# Patient Record
Sex: Female | Born: 1955 | Race: White | Hispanic: No | Marital: Married | Smoking: Never smoker
Health system: Southern US, Community
[De-identification: ages and names within clinical notes are randomized; demographics above are authoritative.]

## PROBLEM LIST (undated history)

## (undated) DIAGNOSIS — M1990 Unspecified osteoarthritis, unspecified site: Secondary | ICD-10-CM

## (undated) DIAGNOSIS — M810 Age-related osteoporosis without current pathological fracture: Secondary | ICD-10-CM

## (undated) DIAGNOSIS — T7840XA Allergy, unspecified, initial encounter: Secondary | ICD-10-CM

## (undated) DIAGNOSIS — C801 Malignant (primary) neoplasm, unspecified: Secondary | ICD-10-CM

## (undated) DIAGNOSIS — K219 Gastro-esophageal reflux disease without esophagitis: Secondary | ICD-10-CM

## (undated) HISTORY — DX: Allergy, unspecified, initial encounter: T78.40XA

## (undated) HISTORY — DX: Age-related osteoporosis without current pathological fracture: M81.0

## (undated) HISTORY — DX: Unspecified osteoarthritis, unspecified site: M19.90

## (undated) HISTORY — PX: EYE SURGERY: SHX253

## (undated) HISTORY — DX: Malignant (primary) neoplasm, unspecified: C80.1

## (undated) HISTORY — DX: Gastro-esophageal reflux disease without esophagitis: K21.9

---

## 1997-07-30 ENCOUNTER — Other Ambulatory Visit: Admission: RE | Admit: 1997-07-30 | Discharge: 1997-07-30 | Payer: Self-pay | Admitting: Obstetrics and Gynecology

## 1998-08-16 ENCOUNTER — Other Ambulatory Visit: Admission: RE | Admit: 1998-08-16 | Discharge: 1998-08-16 | Payer: Self-pay | Admitting: Obstetrics and Gynecology

## 1999-04-11 ENCOUNTER — Encounter: Admission: RE | Admit: 1999-04-11 | Discharge: 1999-04-11 | Payer: Self-pay | Admitting: Obstetrics and Gynecology

## 1999-04-11 ENCOUNTER — Encounter: Payer: Self-pay | Admitting: Obstetrics and Gynecology

## 1999-08-18 ENCOUNTER — Other Ambulatory Visit: Admission: RE | Admit: 1999-08-18 | Discharge: 1999-08-18 | Payer: Self-pay | Admitting: Obstetrics and Gynecology

## 1999-10-12 ENCOUNTER — Encounter: Payer: Self-pay | Admitting: Obstetrics and Gynecology

## 1999-10-12 ENCOUNTER — Encounter: Admission: RE | Admit: 1999-10-12 | Discharge: 1999-10-12 | Payer: Self-pay | Admitting: Obstetrics and Gynecology

## 1999-12-26 ENCOUNTER — Other Ambulatory Visit: Admission: RE | Admit: 1999-12-26 | Discharge: 1999-12-26 | Payer: Self-pay | Admitting: Obstetrics and Gynecology

## 2000-02-20 ENCOUNTER — Other Ambulatory Visit: Admission: RE | Admit: 2000-02-20 | Discharge: 2000-02-20 | Payer: Self-pay | Admitting: Obstetrics and Gynecology

## 2000-10-10 ENCOUNTER — Other Ambulatory Visit: Admission: RE | Admit: 2000-10-10 | Discharge: 2000-10-10 | Payer: Self-pay | Admitting: Obstetrics and Gynecology

## 2000-11-29 ENCOUNTER — Encounter: Payer: Self-pay | Admitting: Obstetrics and Gynecology

## 2000-11-29 ENCOUNTER — Encounter: Admission: RE | Admit: 2000-11-29 | Discharge: 2000-11-29 | Payer: Self-pay | Admitting: Obstetrics and Gynecology

## 2002-02-27 ENCOUNTER — Encounter: Admission: RE | Admit: 2002-02-27 | Discharge: 2002-02-27 | Payer: Self-pay | Admitting: Obstetrics and Gynecology

## 2002-02-27 ENCOUNTER — Encounter: Payer: Self-pay | Admitting: Obstetrics and Gynecology

## 2002-03-06 ENCOUNTER — Encounter: Payer: Self-pay | Admitting: Obstetrics and Gynecology

## 2002-03-06 ENCOUNTER — Encounter: Admission: RE | Admit: 2002-03-06 | Discharge: 2002-03-06 | Payer: Self-pay | Admitting: Obstetrics and Gynecology

## 2002-04-17 ENCOUNTER — Other Ambulatory Visit: Admission: RE | Admit: 2002-04-17 | Discharge: 2002-04-17 | Payer: Self-pay | Admitting: Obstetrics and Gynecology

## 2003-04-30 ENCOUNTER — Other Ambulatory Visit: Admission: RE | Admit: 2003-04-30 | Discharge: 2003-04-30 | Payer: Self-pay | Admitting: Obstetrics and Gynecology

## 2004-06-01 ENCOUNTER — Other Ambulatory Visit: Admission: RE | Admit: 2004-06-01 | Discharge: 2004-06-01 | Payer: Self-pay | Admitting: Obstetrics and Gynecology

## 2008-01-30 HISTORY — PX: FOOT SURGERY: SHX648

## 2012-05-13 ENCOUNTER — Encounter: Payer: Self-pay | Admitting: Internal Medicine

## 2012-07-18 ENCOUNTER — Encounter: Payer: Self-pay | Admitting: Internal Medicine

## 2012-07-18 ENCOUNTER — Ambulatory Visit (AMBULATORY_SURGERY_CENTER): Payer: BC Managed Care – PPO | Admitting: *Deleted

## 2012-07-18 VITALS — Ht 65.0 in | Wt 153.0 lb

## 2012-07-18 DIAGNOSIS — Z1211 Encounter for screening for malignant neoplasm of colon: Secondary | ICD-10-CM

## 2012-07-18 MED ORDER — MOVIPREP 100 G PO SOLR: ORAL | Status: DC

## 2012-08-11 ENCOUNTER — Ambulatory Visit (AMBULATORY_SURGERY_CENTER): Payer: BC Managed Care – PPO | Admitting: Internal Medicine

## 2012-08-11 ENCOUNTER — Encounter: Payer: Self-pay | Admitting: Internal Medicine

## 2012-08-11 VITALS — BP 123/65 | HR 65 | Temp 97.3°F | Resp 14 | Ht 65.0 in | Wt 153.0 lb

## 2012-08-11 DIAGNOSIS — Z1211 Encounter for screening for malignant neoplasm of colon: Secondary | ICD-10-CM

## 2012-08-11 MED ORDER — SODIUM CHLORIDE 0.9 % IV SOLN: 500.0000 mL | INTRAVENOUS | Status: DC

## 2012-08-11 NOTE — Progress Notes (Signed)
Patient did not have preoperative order for IV antibiotic SSI prophylaxis. (G8918)  Patient did not experience any of the following events: a burn prior to discharge; a fall within the facility; wrong site/side/patient/procedure/implant event; or a hospital transfer or hospital admission upon discharge from the facility. (G8907)  

## 2012-08-11 NOTE — Progress Notes (Signed)
0829 2% LIDOCAINE 40MG  IV PREOP

## 2012-08-11 NOTE — Op Note (Signed)
[facility] [address] [place], [address]   COLONOSCOPY PROCEDURE REPORT  PATIENT: Monserratt, Knezevic  MR#: [id number] BIRTHDATE: 03/29/1955 , 56  yrs. old GENDER: Female ENDOSCOPIST: Roxy Cedar, MD REFERRED VH:QIONG Ross, M.D. PROCEDURE DATE:  08/11/2012 PROCEDURE:   Colonoscopy, screening ASA CLASS:   Class II INDICATIONS:average risk screening. MEDICATIONS: MAC sedation, administered by CRNA and propofol (Diprivan) 350mg  IV  DESCRIPTION OF PROCEDURE:   After the risks benefits and alternatives of the procedure were thoroughly explained, informed consent was obtained.  A digital rectal exam revealed no abnormalities of the rectum.   The [id number]  endoscope was introduced through the anus and advanced to the cecum, which was identified by both the appendix and ileocecal valve. No adverse events experienced.   The quality of the prep was excellent, using MoviPrep  The instrument was then slowly withdrawn as the colon was fully examined.      COLON FINDINGS: A normal appearing cecum, ileocecal valve, and appendiceal orifice were identified.  The ascending, hepatic flexure, transverse, splenic flexure, descending, sigmoid colon and rectum appeared unremarkable.  No polyps or cancers were seen. Retroflexed views revealed no abnormalities. The time to cecum=5 minutes 05 seconds.  Withdrawal time=10 minutes 15 seconds.  The scope was withdrawn and the procedure completed. COMPLICATIONS: There were no complications.  ENDOSCOPIC IMPRESSION: 1. Normal colon  RECOMMENDATIONS: 1. Continue current colorectal screening recommendations for "routine risk" patients with a repeat colonoscopy in 10 years.   eSigned:  Roxy Cedar, MD 08/11/2012 8:50 AM   cc: Miguel Aschoff, MD and The Patient

## 2012-08-11 NOTE — Patient Instructions (Addendum)
YOU HAD AN ENDOSCOPIC PROCEDURE TODAY AT THE [facility]: Refer to the procedure report that was given to you for any specific questions about what was found during the examination.  If the procedure report does not answer your questions, please call your gastroenterologist to clarify.  If you requested that your care partner not be given the details of your procedure findings, then the procedure report has been included in a sealed envelope for you to review at your convenience later.  YOU SHOULD EXPECT: Some feelings of bloating in the abdomen. Passage of more gas than usual.  Walking can help get rid of the air that was put into your GI tract during the procedure and reduce the bloating. If you had a lower endoscopy (such as a colonoscopy or flexible sigmoidoscopy) you may notice spotting of blood in your stool or on the toilet paper. If you underwent a bowel prep for your procedure, then you may not have a normal bowel movement for a few days.  DIET: Your first meal following the procedure should be a light meal and then it is ok to progress to your normal diet.  A half-sandwich or bowl of soup is an example of a good first meal.  Heavy or fried foods are harder to digest and may make you feel nauseous or bloated.  Likewise meals heavy in dairy and vegetables can cause extra gas to form and this can also increase the bloating.  Drink plenty of fluids but you should avoid alcoholic beverages for 24 hours.  ACTIVITY: Your care partner should take you home directly after the procedure.  You should plan to take it easy, moving slowly for the rest of the day.  You can resume normal activity the day after the procedure however you should NOT DRIVE or use heavy machinery for 24 hours (because of the sedation medicines used during the test).    SYMPTOMS TO REPORT IMMEDIATELY: A gastroenterologist can be reached at any hour.  During normal business hours, 8:30 AM to 5:00 PM Monday through Friday,  call [phone].  After hours and on weekends, please call the GI answering service at [phone] who will take a message and have the physician on call contact you.   Following lower endoscopy (colonoscopy or flexible sigmoidoscopy):  Excessive amounts of blood in the stool  Significant tenderness or worsening of abdominal pains  Swelling of the abdomen that is new, acute  Fever of 100F or higher  FOLLOW UP: Our staff will call the home number listed on your records the next business day following your procedure to check on you and address any questions or concerns that you may have at that time regarding the information given to you following your procedure. This is a courtesy call and so if there is no answer at the home number and we have not heard from you through the emergency physician on call, we will assume that you have returned to your regular daily activities without incident.  SIGNATURES/CONFIDENTIALITY: You and/or your care partner have signed paperwork which will be entered into your electronic medical record.  These signatures attest to the fact that that the information above on your After Visit Summary has been reviewed and is understood.  Full responsibility of the confidentiality of this discharge information lies with you and/or your care-partner.  Ok to continue your normal medications  Follow up colonoscopy in 10 years

## 2012-08-12 ENCOUNTER — Telehealth: Payer: Self-pay | Admitting: *Deleted

## 2012-08-12 NOTE — Telephone Encounter (Signed)
No identifier, left message, follow-up  

## 2013-05-22 LAB — HM PAP SMEAR: HM Pap smear: NORMAL

## 2013-07-09 ENCOUNTER — Ambulatory Visit: Payer: Self-pay | Admitting: Podiatry

## 2013-07-21 ENCOUNTER — Encounter: Payer: Self-pay | Admitting: Podiatry

## 2013-07-21 ENCOUNTER — Ambulatory Visit (INDEPENDENT_AMBULATORY_CARE_PROVIDER_SITE_OTHER): Payer: BC Managed Care – PPO

## 2013-07-21 ENCOUNTER — Ambulatory Visit (INDEPENDENT_AMBULATORY_CARE_PROVIDER_SITE_OTHER): Payer: BC Managed Care – PPO | Admitting: Podiatry

## 2013-07-21 VITALS — BP 132/75 | HR 95 | Resp 16 | Ht 65.0 in | Wt 147.0 lb

## 2013-07-21 DIAGNOSIS — M948X9 Other specified disorders of cartilage, unspecified sites: Secondary | ICD-10-CM

## 2013-07-21 DIAGNOSIS — M779 Enthesopathy, unspecified: Secondary | ICD-10-CM

## 2013-07-21 DIAGNOSIS — M7750 Other enthesopathy of unspecified foot: Secondary | ICD-10-CM

## 2013-07-21 DIAGNOSIS — M7752 Other enthesopathy of left foot: Secondary | ICD-10-CM

## 2013-07-21 DIAGNOSIS — M2580 Other specified joint disorders, unspecified joint: Secondary | ICD-10-CM

## 2013-07-21 NOTE — Progress Notes (Signed)
   Subjective:    Patient ID: Latoya Reyes, female    DOB: 08/01/1955, 58 y.o.   MRN: [id number]  HPI Comments: "I have pain in this foot"  Patient c/o aching 1st MPJ left foot for about 6 weeks. The area initially was extremely red, swollen and tight plantarly. Trouble walking. Sensitive in the AM. She has been taking Ibuprofen PRN and doing ROM exercises. Some better.     Review of Systems  All other systems reviewed and are negative.      Objective:   Physical Exam: I have reviewed her past medical history medications allergies surgeries social history and review of systems. Pulses are strongly palpable bilateral. Neurologic sensorium is intact per since once the monofilament. Deep tendon reflexes are intact bilateral and muscle strength is 5 over 5 dorsiflexors plantar flexors inverters everters all intrinsic musculature is intact. Orthopedic evaluation demonstrates all joints distal to the ankle a full range of motion without crepitation with exception of the first metatarsophalangeal joint which does have pain on end range of motion of this joint particularly on plantar flexion. She also has erythema and edema to the plantar aspect of the first metatarsophalangeal joint is overlying the sesamoids. Radiographic evaluation does not demonstrate any type of sesamoidal arthropathy however soft tissue increase in density is increased over this area.        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  Plan: Injected 2 mg of dexamethasone to the point of maximal tenderness of the left foot. Should this recur than an MRI will be necessary. This will rule out a soft tissue tumor.

## 2013-11-16 ENCOUNTER — Encounter: Payer: Self-pay | Admitting: Internal Medicine

## 2013-11-16 ENCOUNTER — Ambulatory Visit (INDEPENDENT_AMBULATORY_CARE_PROVIDER_SITE_OTHER): Payer: BC Managed Care – PPO | Admitting: Internal Medicine

## 2013-11-16 VITALS — BP 140/94 | HR 73 | Temp 98.1°F | Resp 18 | Ht 64.0 in | Wt 150.1 lb

## 2013-11-16 DIAGNOSIS — Z23 Encounter for immunization: Secondary | ICD-10-CM

## 2013-11-16 DIAGNOSIS — Z0000 Encounter for general adult medical examination without abnormal findings: Secondary | ICD-10-CM

## 2013-11-16 NOTE — Assessment & Plan Note (Signed)
Flu shot given today. She will have shingles shot when she returns with her mother to a visit in December. She is up-to-date on colonoscopy done in 2014. She has had tetanus shot. She sees OB/GYN and gets mammogram and Pap smear yearly. Spoke with her about bone health and exercise, she does walk 5 days per week.

## 2013-11-16 NOTE — Progress Notes (Signed)
   Subjective:    Patient ID: Latoya Reyes, female    DOB: 03/10/1955, 58 y.o.   MRN: [id number]  HPI The patient is a 58 YO female who is coming to establish care. She has no significant past medical history. She does not have any complaints at today's visit. She denies any significant arthralgias or joint discomfort. She denies any balance problems, hearing problems, vision problems. She denies any medications. She denies any past medical history. No significant family medical history. She would like to talk about the shingles shot as she is very concerned about getting shingles. She has seen several people have shingles and she cemented is not a pleasant experience. She had basic lab work done about a year or 2 ago at her workplace. She walks about 5 days per week and this is her alone time that helps her to destress and relax.  Review of Systems  Constitutional: Negative for fever, activity change, appetite change and fatigue.  HENT: Negative.   Respiratory: Negative for cough, chest tightness, shortness of breath and wheezing.   Cardiovascular: Negative for chest pain, palpitations and leg swelling.  Gastrointestinal: Negative for abdominal pain, diarrhea, constipation and abdominal distention.  Musculoskeletal: Negative.   Skin: Negative.   Neurological: Negative for dizziness, weakness, light-headedness and headaches.  Psychiatric/Behavioral: Negative.       Objective:   Physical Exam  Constitutional: She is oriented to person, place, and time. She appears well-developed and well-nourished.  HENT:  Head: Normocephalic and atraumatic.  Eyes: EOM are normal.  Neck: Normal range of motion.  Cardiovascular: Normal rate and regular rhythm.   No murmur heard. Pulmonary/Chest: Breath sounds normal. No respiratory distress. She has no wheezes. She has no rales.  Abdominal: Soft. Bowel sounds are normal.  Musculoskeletal: She exhibits no tenderness.  Neurological: She is alert and oriented  to person, place, and time. Coordination normal.  Skin: Skin is warm and dry.   Filed Vitals:   11/16/13 1009  BP: 140/94  Pulse: 73  Temp: 98.1 F (36.7 C)  TempSrc: Oral  Resp: 18  Height: 5\' 4"  (1.626 m)  Weight: 150 lb 1.9 oz (68.094 kg)  SpO2: 99%      Assessment & Plan:  Flu shot given at today's visit.

## 2013-11-16 NOTE — Progress Notes (Signed)
Pre visit review using our clinic review tool, if applicable. No additional management support is needed unless otherwise documented below in the visit note. 

## 2013-11-16 NOTE — Patient Instructions (Signed)
Go ahead and get the flu shot this Friday and then get the shingles shot here when you come back with your mom in December.  You are doing great and keep up the good work with the exercise.   Health Maintenance Adopting a healthy lifestyle and getting preventive care can go a long way to promote health and wellness. Talk with your health care provider about what schedule of regular examinations is right for you. This is a good chance for you to check in with your provider about disease prevention and staying healthy. In between checkups, there are plenty of things you can do on your own. Experts have done a lot of research about which lifestyle changes and preventive measures are most likely to keep you healthy. Ask your health care provider for more information. WEIGHT AND DIET  Eat a healthy diet  Be sure to include plenty of vegetables, fruits, low-fat dairy products, and lean protein.  Do not eat a lot of foods high in solid fats, added sugars, or salt.  Get regular exercise. This is one of the most important things you can do for your health.  Most adults should exercise for at least 150 minutes each week. The exercise should increase your heart rate and make you sweat (moderate-intensity exercise).  Most adults should also do strengthening exercises at least twice a week. This is in addition to the moderate-intensity exercise.  Maintain a healthy weight  Body mass index (BMI) is a measurement that can be used to identify possible weight problems. It estimates body fat based on height and weight. Your health care provider can help determine your BMI and help you achieve or maintain a healthy weight.  For females 60 years of age and older:   A BMI below 18.5 is considered underweight.  A BMI of 18.5 to 24.9 is normal.  A BMI of 25 to 29.9 is considered overweight.  A BMI of 30 and above is considered obese.  Watch levels of cholesterol and blood lipids  You should start  having your blood tested for lipids and cholesterol at 60 years of age, then have this test every 5 years.  You may need to have your cholesterol levels checked more often if:  Your lipid or cholesterol levels are high.  You are older than 60 years of age.  You are at high risk for heart disease.  HEART DISEASE, DIABETES, AND HIGH BLOOD PRESSURE   Have your blood pressure checked at least every 1-2 years. High blood pressure causes heart disease and increases the risk of stroke.  If you are between 3755 years and 60 years old, ask your health care provider if you should take aspirin to prevent strokes.  Have regular diabetes screenings. This involves taking a blood sample to check your fasting blood sugar level.  If you are at a normal weight and have a low risk for diabetes, have this test once every three years after 60 years of age.  If you are overweight and have a high risk for diabetes, consider being tested at a younger age or more often. OSTEOPOROSIS AND MENOPAUSE   Osteoporosis is a disease in which the bones lose minerals and strength with aging. This can result in serious bone fractures. Your risk for osteoporosis can be identified using a bone density scan.  If you are 60 years of age or older, or if you are at risk for osteoporosis and fractures, ask your health care provider if you should be  screened.  Ask your health care provider whether you should take a calcium or vitamin D supplement to lower your risk for osteoporosis.  Menopause may have certain physical symptoms and risks.  Hormone replacement therapy may reduce some of these symptoms and risks. Talk to your health care provider about whether hormone replacement therapy is right for you.  HOME CARE INSTRUCTIONS   Schedule regular health, dental, and eye exams.  Stay current with your immunizations.   Do not use any tobacco products including cigarettes, chewing tobacco, or electronic cigarettes.  If you  are pregnant, do not drink alcohol.  If you are breastfeeding, limit how much and how often you drink alcohol.  Limit alcohol intake to no more than 1 drink per day for nonpregnant women. One drink equals 12 ounces of beer, 5 ounces of wine, or 1 ounces of hard liquor.  Do not use street drugs.  Do not share needles.  Ask your health care provider for help if you need support or information about quitting drugs.  Tell your health care provider if you often feel depressed.  Tell your health care provider if you have ever been abused or do not feel safe at home. Document Released: 07/31/2010 Document Revised: 06/01/2013 Document Reviewed: 12/17/2012 [facility] Patient Information 2015 [employer], [place]. This information is not intended to replace advice given to you by your health care provider. Make sure you discuss any questions you have with your health care provider.

## 2013-12-18 ENCOUNTER — Encounter: Payer: Self-pay | Admitting: Internal Medicine

## 2013-12-18 ENCOUNTER — Ambulatory Visit (INDEPENDENT_AMBULATORY_CARE_PROVIDER_SITE_OTHER): Payer: BC Managed Care – PPO | Admitting: Internal Medicine

## 2013-12-18 VITALS — BP 118/82 | HR 73 | Temp 97.7°F | Ht 64.0 in | Wt 150.1 lb

## 2013-12-18 DIAGNOSIS — L209 Atopic dermatitis, unspecified: Secondary | ICD-10-CM

## 2013-12-18 MED ORDER — METHYLPREDNISOLONE ACETATE 80 MG/ML IJ SUSP: 80.0000 mg | Freq: Once | INTRAMUSCULAR | Status: DC

## 2013-12-18 MED ORDER — METHYLPREDNISOLONE ACETATE 80 MG/ML IJ SUSP
80.0000 mg | Freq: Once | INTRAMUSCULAR | Status: AC
  Administered 2013-12-18: 80 mg via INTRAMUSCULAR

## 2013-12-18 MED ORDER — CLOTRIMAZOLE-BETAMETHASONE 1-0.05 % EX CREA: TOPICAL_CREAM | CUTANEOUS | Status: DC

## 2013-12-18 NOTE — Progress Notes (Signed)
   Subjective:    Patient ID: Latoya Reyes, female    DOB: 06/15/1955, 58 y.o.   MRN: [id number]  HPI here to f/u with 2 wks onset multiple but less than 20 erythem spots to the torso and right jawline various sizes but all quite pruritic, and one rather larger > 1 cm to right chest with silvery center and erythem borders suggestive of ringworm. No pain or fever or swelling.  Has new cat at home.  No prior other hx of allergies or atopy. Asks for shingles shot rx to have done cheaper at a pharmacy Past Medical History  Diagnosis Date  . Arthritis     foot; right   Past Surgical History  Procedure Laterality Date  . Foot surgery  2010    right    reports that she has never smoked. She has never used smokeless tobacco. She reports that she does not drink alcohol or use illicit drugs. family history includes Diabetes in her brother, father, and mother; Heart disease in her father; Kidney disease in her mother. There is no history of Colon cancer. No Known Allergies Current Outpatient Prescriptions on File Prior to Visit  Medication Sig Dispense Refill  . aspirin 81 MG tablet Take 81 mg by mouth daily.    . calcium carbonate (OS-CAL) 600 MG TABS Take 600 mg by mouth daily.    . Cranberry-Cholecalciferol 4200-500 MG-UNIT CAPS Take by mouth daily.    Marland Kitchen. ESTROGENS CONJUGATED PO Take by mouth daily.    Marland Kitchen. GLUCOSAMINE HCL PO Take by mouth daily.    Marland Kitchen. glucosamine-chondroitin 500-400 MG tablet Take 1 tablet by mouth 3 (three) times daily.    . Multiple Vitamin (MULTIVITAMIN) capsule Take 1 capsule by mouth daily.    . Nutritional Supplements (ESTRO SUPPORT ES PO) Take by mouth.    . Omega-3 Fatty Acids (FISH OIL) 1200 MG CAPS Take by mouth daily.    Marland Kitchen. POTASSIUM GLUCONATE PO Take by mouth. Takes 550 mg daily     No current facility-administered medications on file prior to visit.   Review of Systems All otherwise neg per pt     Objective:   Physical Exam BP 118/82 mmHg  Pulse 73  Temp(Src)  97.7 F (36.5 C) (Oral)  Ht 5\' 4"  (1.626 m)  Wt 150 lb 2 oz (68.096 kg)  BMI 25.76 kg/m2  SpO2 97% VS noted,  Constitutional: Pt appears well-developed, well-nourished.  HENT: Head: NCAT.  Right Ear: External ear normal.  Left Ear: External ear normal.  Eyes: . Pupils are equal, round, and reactive to light. Conjunctivae and EOM are normal Neck: Normal range of motion. Neck supple.  Cardiovascular: Normal rate and regular rhythm.   Pulmonary/Chest: Effort normal and breath sounds normal.  Abd:  Soft, NT, ND, + BS Neurological: Pt is alert. Not confused , motor grossly intact Skin: Skin is warm Multiple areas < 20 to torso and 1 to right mid jawline, flat/non raised, erythem nontender lesions; 1 lesion to right mid chest just > 1 cm oval with nonraised erythem border, silvery central area; overall no red streaks, fluctuance or drainage Psychiatric: Pt behavior is normal. No agitation.     Assessment & Plan:

## 2013-12-18 NOTE — Patient Instructions (Signed)
You had the steroid shot today  Please take all new medication as prescribed - the cream (which works for allergic type rash as well as anti-fungal)  You are given the Shingles shot prescription today; please let the staff know at your next visit when you have had this done  Please continue all other medications as before, and refills have been done if requested.  Please have the pharmacy call with any other refills you may need.  Please keep your appointments with your specialists as you may have planned

## 2013-12-18 NOTE — Assessment & Plan Note (Signed)
New clinical dx likely allergic in origin, for depomedrol IM, also lotrisone cream to various lesion asd prn,  to f/u any worsening symptoms or concerns

## 2013-12-18 NOTE — Addendum Note (Signed)
Addended by: Tonye BecketAIRRIKIER, Takoda Janowiak M on: 12/18/2013 02:06 PM   Modules accepted: Orders

## 2013-12-18 NOTE — Progress Notes (Signed)
Pre visit review using our clinic review tool, if applicable. No additional management support is needed unless otherwise documented below in the visit note. 

## 2014-02-18 ENCOUNTER — Other Ambulatory Visit: Payer: Self-pay | Admitting: Internal Medicine

## 2014-04-29 ENCOUNTER — Telehealth: Payer: Self-pay | Admitting: Internal Medicine

## 2014-04-29 DIAGNOSIS — R21 Rash and other nonspecific skin eruption: Secondary | ICD-10-CM

## 2014-04-29 NOTE — Telephone Encounter (Signed)
Done

## 2014-04-29 NOTE — Telephone Encounter (Signed)
Pt request referral for dermatology due to rash that she saw Dr.Kollar for.

## 2014-11-09 ENCOUNTER — Ambulatory Visit: Payer: BC Managed Care – PPO | Admitting: Family

## 2015-05-06 ENCOUNTER — Encounter: Payer: Self-pay | Admitting: Nurse Practitioner

## 2015-05-06 ENCOUNTER — Ambulatory Visit: Payer: BC Managed Care – PPO | Admitting: Nurse Practitioner

## 2015-05-06 ENCOUNTER — Ambulatory Visit (INDEPENDENT_AMBULATORY_CARE_PROVIDER_SITE_OTHER): Payer: BC Managed Care – PPO | Admitting: Nurse Practitioner

## 2015-05-06 VITALS — BP 158/90 | HR 101 | Temp 98.9°F | Ht 64.0 in | Wt 157.0 lb

## 2015-05-06 DIAGNOSIS — J069 Acute upper respiratory infection, unspecified: Secondary | ICD-10-CM | POA: Diagnosis not present

## 2015-05-06 MED ORDER — HYDROCOD POLST-CPM POLST ER 10-8 MG/5ML PO SUER: 5.0000 mL | Freq: Every evening | ORAL | Status: DC | PRN

## 2015-05-06 MED ORDER — AMOXICILLIN-POT CLAVULANATE 875-125 MG PO TABS: 1.0000 | ORAL_TABLET | Freq: Two times a day (BID) | ORAL | Status: DC

## 2015-05-06 MED ORDER — FLUTICASONE PROPIONATE 50 MCG/ACT NA SUSP: 2.0000 | Freq: Every day | NASAL | Status: DC

## 2015-05-06 NOTE — Progress Notes (Signed)
Patient ID: Latoya Reyes, female    DOB: 05/31/1955  Age: 60 y.o. MRN: [id number]  CC: Cough   HPI [name] presents for CC of cough x 7 days.   1) patient reports that she was outside doing yard work on Saturday. She began coughing on Wednesday Congestion, sneezing, head pressure started on Sunday Nasal drainage productive cough started clear return to greenish yellow production.  Denies fevers, chills, sweats   Treatment to date:  Allegra-D- Not helpful    History Gunnar Fusiaula has a past medical history of Arthritis.   She has past surgical history that includes Foot surgery (2010).   Her family history includes Diabetes in her brother, father, and mother; Heart disease in her father; Kidney disease in her mother. There is no history of Colon cancer.She reports that she has never smoked. She has never used smokeless tobacco. She reports that she does not drink alcohol or use illicit drugs.  Outpatient Prescriptions Prior to Visit  Medication Sig Dispense Refill  . aspirin 81 MG tablet Take 81 mg by mouth daily.    . calcium carbonate (OS-CAL) 600 MG TABS Take 600 mg by mouth daily.    . Cranberry-Cholecalciferol 4200-500 MG-UNIT CAPS Take by mouth daily.    Marland Kitchen. ESTROGENS CONJUGATED PO Take by mouth daily.    Marland Kitchen. glucosamine-chondroitin 500-400 MG tablet Take 1 tablet by mouth 2 (two) times daily.     . Multiple Vitamin (MULTIVITAMIN) capsule Take 1 capsule by mouth daily.    . Nutritional Supplements (ESTRO SUPPORT ES PO) Take by mouth.    . Omega-3 Fatty Acids (FISH OIL) 1200 MG CAPS Take by mouth daily.    Marland Kitchen. POTASSIUM GLUCONATE PO Take by mouth. Takes 550 mg daily    . clotrimazole-betamethasone (LOTRISONE) cream USE AS DIRECTED TWICE PER DAY AS NEEDED 45 g 0  . GLUCOSAMINE HCL PO Take by mouth daily.     No facility-administered medications prior to visit.    ROS Review of Systems  Constitutional: Positive for fatigue. Negative for fever, chills and diaphoresis.  HENT:  Positive for congestion, postnasal drip, rhinorrhea, sinus pressure and sneezing. Negative for ear pain and sore throat.   Respiratory: Positive for cough. Negative for chest tightness, shortness of breath and wheezing.   Cardiovascular: Negative for chest pain, palpitations and leg swelling.  Gastrointestinal: Negative for nausea, vomiting and diarrhea.  Skin: Negative for rash.  Neurological: Negative for dizziness and headaches.    Objective:  BP 158/90 mmHg  Pulse 101  Temp(Src) 98.9 F (37.2 C) (Oral)  Ht 5\' 4"  (1.626 m)  Wt 157 lb (71.215 kg)  BMI 26.94 kg/m2  SpO2 97%  Physical Exam  Constitutional: She is oriented to person, place, and time. She appears well-developed and well-nourished. No distress.  Patient appears ill  HENT:  Head: Normocephalic and atraumatic.  Right Ear: External ear normal.  Left Ear: External ear normal.  Mouth/Throat: Oropharynx is clear and moist. No oropharyngeal exudate.  TMs clear bilaterally  Eyes: EOM are normal. Pupils are equal, round, and reactive to light. Right eye exhibits no discharge. Left eye exhibits no discharge. No scleral icterus.  Neck: Normal range of motion. Neck supple.  Cardiovascular: Regular rhythm.   Pulmonary/Chest: Effort normal and breath sounds normal. No respiratory distress. She has no wheezes. She has no rales. She exhibits no tenderness.  Lymphadenopathy:    She has cervical adenopathy.  Neurological: She is alert and oriented to person, place, and time.  Skin:  Skin is warm and dry. No rash noted. She is not diaphoretic.  Psychiatric: She has a normal mood and affect. Her behavior is normal. Judgment and thought content normal.   Assessment & Plan:   Abeera was seen today for cough.  Diagnoses and all orders for this visit:  Acute URI  Other orders -     chlorpheniramine-HYDROcodone (TUSSIONEX PENNKINETIC ER) 10-8 MG/5ML SUER; Take 5 mLs by mouth at bedtime as needed for cough. -      amoxicillin-clavulanate (AUGMENTIN) 875-125 MG tablet; Take 1 tablet by mouth 2 (two) times daily. -     fluticasone (FLONASE) 50 MCG/ACT nasal spray; Place 2 sprays into both nostrils daily.  I have discontinued Ms. Vandeusen's GLUCOSAMINE HCL PO and clotrimazole-betamethasone. I am also having her start on chlorpheniramine-HYDROcodone, amoxicillin-clavulanate, and fluticasone. Additionally, I am having her maintain her aspirin, glucosamine-chondroitin, POTASSIUM GLUCONATE PO, Fish Oil, Cranberry-Cholecalciferol, calcium carbonate, ESTROGENS CONJUGATED PO, multivitamin, Nutritional Supplements (ESTRO SUPPORT ES PO), and fexofenadine-pseudoephedrine.  Meds ordered this encounter  Medications  . fexofenadine-pseudoephedrine (ALLEGRA-D) 60-120 MG 12 hr tablet    Sig: Take 1 tablet by mouth 2 (two) times daily.  . chlorpheniramine-HYDROcodone (TUSSIONEX PENNKINETIC ER) 10-8 MG/5ML SUER    Sig: Take 5 mLs by mouth at bedtime as needed for cough.    Dispense:  115 mL    Refill:  0    Order Specific Question:  Supervising Provider    Answer:  Darrick Huntsman, TERESA L [[id number]]  . amoxicillin-clavulanate (AUGMENTIN) 875-125 MG tablet    Sig: Take 1 tablet by mouth 2 (two) times daily.    Dispense:  14 tablet    Refill:  0    Order Specific Question:  Supervising Provider    Answer:  Duncan Dull L [[id number]]  . fluticasone (FLONASE) 50 MCG/ACT nasal spray    Sig: Place 2 sprays into both nostrils daily.    Dispense:  16 g    Refill:  6    Order Specific Question:  Supervising Provider    Answer:  Sherlene Shams [[id number]]     Follow-up: Return if symptoms worsen or fail to improve.

## 2015-05-06 NOTE — Assessment & Plan Note (Addendum)
New Onset Due to length of symptoms with worsening will treat empirically  Augmentin was sent to the pharmacy Flonase sent to pharmacy STOP Allegra-D (blood pressure elevated) Encouraged Probiotics Tussionex printed, sent with her to take to pharmacy  Discussed drowsy effects and how much to take Continue OTC measures  FU prn worsening/failure to improve.

## 2015-05-06 NOTE — Patient Instructions (Signed)
5 mL (1 teaspoon) of cough syrup. Don't drive or make important decisions while taking this....just sleep and rest.   Flonase and Augmentin as prescribed.   Robitussin or Delsym - Over the counter for daytime cough.

## 2015-05-06 NOTE — Progress Notes (Signed)
Pre visit review using our clinic review tool, if applicable. No additional management support is needed unless otherwise documented below in the visit note. 

## 2016-08-27 LAB — HM PAP SMEAR

## 2016-08-27 LAB — HM MAMMOGRAPHY

## 2016-08-28 ENCOUNTER — Other Ambulatory Visit: Payer: Self-pay | Admitting: Obstetrics & Gynecology

## 2016-08-28 DIAGNOSIS — E2839 Other primary ovarian failure: Secondary | ICD-10-CM

## 2016-09-03 ENCOUNTER — Encounter: Payer: Self-pay | Admitting: Internal Medicine

## 2016-09-03 NOTE — Progress Notes (Signed)
Abstracted and sent to scan  

## 2016-12-05 ENCOUNTER — Other Ambulatory Visit: Payer: Self-pay | Admitting: Nurse Practitioner

## 2016-12-15 ENCOUNTER — Other Ambulatory Visit: Payer: Self-pay | Admitting: Nurse Practitioner

## 2017-01-10 ENCOUNTER — Encounter: Payer: Self-pay | Admitting: Internal Medicine

## 2017-01-10 ENCOUNTER — Ambulatory Visit: Payer: BC Managed Care – PPO | Admitting: Internal Medicine

## 2017-01-10 VITALS — BP 130/90 | HR 71 | Temp 98.4°F | Ht 64.0 in | Wt 158.0 lb

## 2017-01-10 DIAGNOSIS — Z7189 Other specified counseling: Secondary | ICD-10-CM

## 2017-01-10 DIAGNOSIS — Z23 Encounter for immunization: Secondary | ICD-10-CM

## 2017-01-10 DIAGNOSIS — Z7184 Encounter for health counseling related to travel: Secondary | ICD-10-CM

## 2017-01-10 MED ORDER — CIPROFLOXACIN HCL 500 MG PO TABS: 500.0000 mg | ORAL_TABLET | Freq: Two times a day (BID) | ORAL | 0 refills | Status: DC

## 2017-01-10 MED ORDER — FLUTICASONE PROPIONATE 50 MCG/ACT NA SUSP: 2.0000 | Freq: Every day | NASAL | 6 refills | Status: DC

## 2017-01-10 MED ORDER — TYPHOID VACCINE PO CPDR: 1.0000 | DELAYED_RELEASE_CAPSULE | ORAL | 0 refills | Status: DC

## 2017-01-10 NOTE — Progress Notes (Signed)
   Subjective:    Patient ID: Latoya Reyes, female    DOB: 03/13/1955, 61 y.o.   MRN: [id number]  HPI The patient is a 61 YO female coming in for travel advice about an upcoming mission trip to [place]. 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 She has scheduled a typhoid vaccine with health department but wants to know if the oral would be cheaper. She has tetanus up to date. Has traveled out of the country a reasonable amount.   Review of Systems  Constitutional: Negative.   HENT: Negative.   Eyes: Negative.   Respiratory: Negative for cough, chest tightness and shortness of breath.   Cardiovascular: Negative for chest pain, palpitations and leg swelling.  Gastrointestinal: Negative for abdominal distention, abdominal pain, constipation, diarrhea, nausea and vomiting.  Musculoskeletal: Negative.   Skin: Negative.   Neurological: Negative.   Psychiatric/Behavioral: Negative.       Objective:   Physical Exam  Constitutional: She is oriented to person, place, and time. She appears well-developed and well-nourished.  HENT:  Head: Normocephalic and atraumatic.  Eyes: EOM are normal.  Neck: Normal range of motion.  Cardiovascular: Normal rate and regular rhythm.  Pulmonary/Chest: Effort normal and breath sounds normal. No respiratory distress. She has no wheezes. She has no rales.  Abdominal: Soft. Bowel sounds are normal. She exhibits no distension. There is no tenderness. There is no rebound.  Musculoskeletal: She exhibits no edema.  Neurological: She is alert and oriented to person, place, and time. Coordination normal.  Skin: Skin is warm and dry.  Psychiatric: She has a normal mood and affect.   Vitals:   01/10/17 1327  BP: 130/90  Pulse: 71  Temp: 98.4 F (36.9 C)  TempSrc: Oral  SpO2: 100%  Weight: 158 lb (71.7 kg)  Height: 5\' 4"  (1.626 m)      Assessment & Plan:  Hepatitis A IM given

## 2017-01-10 NOTE — Assessment & Plan Note (Signed)
Rx for ciprofloxacin for diarrhea if needed. Rx for oral typhoid vaccine. Counseled about up to date tetanus vaccine. Advised about avoiding animals as other countries do not vaccinate for rabies. Hepatitis A given at visit and hep b up to date.

## 2017-01-10 NOTE — Patient Instructions (Signed)
We have sent in the typhoid medication to take if needed. Take 1 pill every other day, starting 2 weeks at least before leaving the country.   We have given you the hepatitis A vaccine today and you should take the second one in about 1 year. Then this will cover you for life.   We have sent in the flonase and ciprofloxacin.   If you get diarrhea or UTI during the trip start taking ciprofloxacin twice a day for 5 days.   The http://www.[place].info/cdc.gov/travel has a lot of information about different countries.

## 2017-08-06 ENCOUNTER — Ambulatory Visit (INDEPENDENT_AMBULATORY_CARE_PROVIDER_SITE_OTHER): Payer: BC Managed Care – PPO | Admitting: Internal Medicine

## 2017-08-06 ENCOUNTER — Other Ambulatory Visit (INDEPENDENT_AMBULATORY_CARE_PROVIDER_SITE_OTHER): Payer: BC Managed Care – PPO

## 2017-08-06 ENCOUNTER — Encounter: Payer: Self-pay | Admitting: Internal Medicine

## 2017-08-06 VITALS — BP 140/90 | HR 78 | Temp 97.9°F | Ht 64.0 in | Wt 151.0 lb

## 2017-08-06 DIAGNOSIS — Z0000 Encounter for general adult medical examination without abnormal findings: Secondary | ICD-10-CM | POA: Diagnosis not present

## 2017-08-06 DIAGNOSIS — Z1159 Encounter for screening for other viral diseases: Secondary | ICD-10-CM

## 2017-08-06 LAB — LIPID PANEL
CHOL/HDL RATIO: 3
CHOLESTEROL: 189 mg/dL (ref 0–200)
HDL: 65.1 mg/dL (ref >39.00)
LDL CALC: 104 mg/dL — AB (ref 0–99)
NonHDL: 123.43
Triglycerides: 98 mg/dL (ref 0.0–149.0)
VLDL: 19.6 mg/dL (ref 0.0–40.0)

## 2017-08-06 LAB — COMPREHENSIVE METABOLIC PANEL
ALT: 12 U/L (ref 0–35)
AST: 16 U/L (ref 0–37)
Albumin: 4.2 g/dL (ref 3.5–5.2)
Alkaline Phosphatase: 63 U/L (ref 39–117)
BUN: 8 mg/dL (ref 6–23)
CO2: 27 meq/L (ref 19–32)
CREATININE: 0.74 mg/dL (ref 0.40–1.20)
Calcium: 9.2 mg/dL (ref 8.4–10.5)
Chloride: 106 mEq/L (ref 96–112)
GFR: 84.02 mL/min (ref >60.00)
GLUCOSE: 91 mg/dL (ref 70–99)
POTASSIUM: 3.8 meq/L (ref 3.5–5.1)
Sodium: 139 mEq/L (ref 135–145)
Total Bilirubin: 0.4 mg/dL (ref 0.2–1.2)
Total Protein: 7.3 g/dL (ref 6.0–8.3)

## 2017-08-06 LAB — CBC
HEMATOCRIT: 43.3 % (ref 36.0–46.0)
Hemoglobin: 14.3 g/dL (ref 12.0–15.0)
MCHC: 33.1 g/dL (ref 30.0–36.0)
MCV: 90.9 fl (ref 78.0–100.0)
Platelets: 275 10*3/uL (ref 150.0–400.0)
RBC: 4.77 Mil/uL (ref 3.87–5.11)
RDW: 13.6 % (ref 11.5–15.5)
WBC: 8.8 10*3/uL (ref 4.0–10.5)

## 2017-08-06 LAB — VITAMIN D 25 HYDROXY (VIT D DEFICIENCY, FRACTURES): VITD: 40.56 ng/mL (ref 30.00–100.00)

## 2017-08-06 MED ORDER — ZOSTER VAC RECOMB ADJUVANTED 50 MCG/0.5ML IM SUSR: 0.5000 mL | Freq: Once | INTRAMUSCULAR | 1 refills | Status: AC

## 2017-08-06 NOTE — Assessment & Plan Note (Signed)
Mammogram and pap smear with gyn and getting in August. Hep c screening done today, declines need for hiv screening. Colonoscopy due 2024. Allergy to flu vaccine. Tetanus up to date. Needs second Hep a next year. Given shingrix rx today. Checking labs. Counseled about sun safety and mole surveillance. Given screening recommendations.

## 2017-08-06 NOTE — Patient Instructions (Addendum)
Miralax is something to try for the constipation.   Health Maintenance, Female Adopting a healthy lifestyle and getting preventive care can go a long way to promote health and wellness. Talk with your health care provider about what schedule of regular examinations is right for you. This is a good chance for you to check in with your provider about disease prevention and staying healthy. In between checkups, there are plenty of things you can do on your own. Experts have done a lot of research about which lifestyle changes and preventive measures are most likely to keep you healthy. Ask your health care provider for more information. Weight and diet Eat a healthy diet  Be sure to include plenty of vegetables, fruits, low-fat dairy products, and lean protein.  Do not eat a lot of foods high in solid fats, added sugars, or salt.  Get regular exercise. This is one of the most important things you can do for your health. ? Most adults should exercise for at least 150 minutes each week. The exercise should increase your heart rate and make you sweat (moderate-intensity exercise). ? Most adults should also do strengthening exercises at least twice a week. This is in addition to the moderate-intensity exercise.  Maintain a healthy weight  Body mass index (BMI) is a measurement that can be used to identify possible weight problems. It estimates body fat based on height and weight. Your health care provider can help determine your BMI and help you achieve or maintain a healthy weight.  For females 92 years of age and older: ? A BMI below 18.5 is considered underweight. ? A BMI of 18.5 to 24.9 is normal. ? A BMI of 25 to 29.9 is considered overweight. ? A BMI of 30 and above is considered obese.  Watch levels of cholesterol and blood lipids  You should start having your blood tested for lipids and cholesterol at 64 years of age, then have this test every 5 years.  You may need to have your  cholesterol levels checked more often if: ? Your lipid or cholesterol levels are high. ? You are older than 64 years of age. ? You are at high risk for heart disease.  Cancer screening Lung Cancer  Lung cancer screening is recommended for adults 61-16 years old who are at high risk for lung cancer because of a history of smoking.  A yearly low-dose CT scan of the lungs is recommended for people who: ? Currently smoke. ? Have quit within the past 15 years. ? Have at least a 30-pack-year history of smoking. A pack year is smoking an average of one pack of cigarettes a day for 1 year.  Yearly screening should continue until it has been 15 years since you quit.  Yearly screening should stop if you develop a health problem that would prevent you from having lung cancer treatment.  Breast Cancer  Practice breast self-awareness. This means understanding how your breasts normally appear and feel.  It also means doing regular breast self-exams. Let your health care provider know about any changes, no matter how small.  If you are in your 20s or 30s, you should have a clinical breast exam (CBE) by a health care provider every 1-3 years as part of a regular health exam.  If you are 28 or older, have a CBE every year. Also consider having a breast X-ray (mammogram) every year.  If you have a family history of breast cancer, talk to your health care provider  about genetic screening.  If you are at high risk for breast cancer, talk to your health care provider about having an MRI and a mammogram every year.  Breast cancer gene (BRCA) assessment is recommended for women who have family members with BRCA-related cancers. BRCA-related cancers include: ? Breast. ? Ovarian. ? Tubal. ? Peritoneal cancers.  Results of the assessment will determine the need for genetic counseling and BRCA1 and BRCA2 testing.  Cervical Cancer Your health care provider may recommend that you be screened regularly  for cancer of the pelvic organs (ovaries, uterus, and vagina). This screening involves a pelvic examination, including checking for microscopic changes to the surface of your cervix (Pap test). You may be encouraged to have this screening done every 3 years, beginning at age 19.  For women ages 50-65, health care providers may recommend pelvic exams and Pap testing every 3 years, or they may recommend the Pap and pelvic exam, combined with testing for human papilloma virus (HPV), every 5 years. Some types of HPV increase your risk of cervical cancer. Testing for HPV may also be done on women of any age with unclear Pap test results.  Other health care providers may not recommend any screening for nonpregnant women who are considered low risk for pelvic cancer and who do not have symptoms. Ask your health care provider if a screening pelvic exam is right for you.  If you have had past treatment for cervical cancer or a condition that could lead to cancer, you need Pap tests and screening for cancer for at least 20 years after your treatment. If Pap tests have been discontinued, your risk factors (such as having a new sexual partner) need to be reassessed to determine if screening should resume. Some women have medical problems that increase the chance of getting cervical cancer. In these cases, your health care provider may recommend more frequent screening and Pap tests.  Colorectal Cancer  This type of cancer can be detected and often prevented.  Routine colorectal cancer screening usually begins at 64 years of age and continues through 64 years of age.  Your health care provider may recommend screening at an earlier age if you have risk factors for colon cancer.  Your health care provider may also recommend using home test kits to check for hidden blood in the stool.  A small camera at the end of a tube can be used to examine your colon directly (sigmoidoscopy or colonoscopy). This is done to  check for the earliest forms of colorectal cancer.  Routine screening usually begins at age 28.  Direct examination of the colon should be repeated every 5-10 years through 64 years of age. However, you may need to be screened more often if early forms of precancerous polyps or small growths are found.  Skin Cancer  Check your skin from head to toe regularly.  Tell your health care provider about any new moles or changes in moles, especially if there is a change in a mole's shape or color.  Also tell your health care provider if you have a mole that is larger than the size of a pencil eraser.  Always use sunscreen. Apply sunscreen liberally and repeatedly throughout the day.  Protect yourself by wearing long sleeves, pants, a wide-brimmed hat, and sunglasses whenever you are outside.  Heart disease, diabetes, and high blood pressure  High blood pressure causes heart disease and increases the risk of stroke. High blood pressure is more likely to develop in: ?  People who have blood pressure in the high end of the normal range (130-139/85-89 mm Hg). ? People who are overweight or obese. ? People who are African American.  If you are 18-39 years of age, have your blood pressure checked every 3-5 years. If you are 40 years of age or older, have your blood pressure checked every year. You should have your blood pressure measured twice-once when you are at a hospital or clinic, and once when you are not at a hospital or clinic. Record the average of the two measurements. To check your blood pressure when you are not at a hospital or clinic, you can use: ? An automated blood pressure machine at a pharmacy. ? A home blood pressure monitor.  If you are between 55 years and 79 years old, ask your health care provider if you should take aspirin to prevent strokes.  Have regular diabetes screenings. This involves taking a blood sample to check your fasting blood sugar level. ? If you are at a  normal weight and have a low risk for diabetes, have this test once every three years after 64 years of age. ? If you are overweight and have a high risk for diabetes, consider being tested at a younger age or more often. Preventing infection Hepatitis B  If you have a higher risk for hepatitis B, you should be screened for this virus. You are considered at high risk for hepatitis B if: ? You were born in a country where hepatitis B is common. Ask your health care provider which countries are considered high risk. ? Your parents were born in a high-risk country, and you have not been immunized against hepatitis B (hepatitis B vaccine). ? You have HIV or AIDS. ? You use needles to inject street drugs. ? You live with someone who has hepatitis B. ? You have had sex with someone who has hepatitis B. ? You get hemodialysis treatment. ? You take certain medicines for conditions, including cancer, organ transplantation, and autoimmune conditions.  Hepatitis C  Blood testing is recommended for: ? Everyone born from 1945 through 1965. ? Anyone with known risk factors for hepatitis C.  Sexually transmitted infections (STIs)  You should be screened for sexually transmitted infections (STIs) including gonorrhea and chlamydia if: ? You are sexually active and are younger than 64 years of age. ? You are older than 64 years of age and your health care provider tells you that you are at risk for this type of infection. ? Your sexual activity has changed since you were last screened and you are at an increased risk for chlamydia or gonorrhea. Ask your health care provider if you are at risk.  If you do not have HIV, but are at risk, it may be recommended that you take a prescription medicine daily to prevent HIV infection. This is called pre-exposure prophylaxis (PrEP). You are considered at risk if: ? You are sexually active and do not regularly use condoms or know the HIV status of your  partner(s). ? You take drugs by injection. ? You are sexually active with a partner who has HIV.  Talk with your health care provider about whether you are at high risk of being infected with HIV. If you choose to begin PrEP, you should first be tested for HIV. You should then be tested every 3 months for as long as you are taking PrEP. Pregnancy  If you are premenopausal and you may become pregnant, ask your health   care provider about preconception counseling.  If you may become pregnant, take 400 to 800 micrograms (mcg) of folic acid every day.  If you want to prevent pregnancy, talk to your health care provider about birth control (contraception). Osteoporosis and menopause  Osteoporosis is a disease in which the bones lose minerals and strength with aging. This can result in serious bone fractures. Your risk for osteoporosis can be identified using a bone density scan.  If you are 65 years of age or older, or if you are at risk for osteoporosis and fractures, ask your health care provider if you should be screened.  Ask your health care provider whether you should take a calcium or vitamin D supplement to lower your risk for osteoporosis.  Menopause may have certain physical symptoms and risks.  Hormone replacement therapy may reduce some of these symptoms and risks. Talk to your health care provider about whether hormone replacement therapy is right for you. Follow these instructions at home:  Schedule regular health, dental, and eye exams.  Stay current with your immunizations.  Do not use any tobacco products including cigarettes, chewing tobacco, or electronic cigarettes.  If you are pregnant, do not drink alcohol.  If you are breastfeeding, limit how much and how often you drink alcohol.  Limit alcohol intake to no more than 1 drink per day for nonpregnant women. One drink equals 12 ounces of beer, 5 ounces of wine, or 1 ounces of hard liquor.  Do not use street  drugs.  Do not share needles.  Ask your health care provider for help if you need support or information about quitting drugs.  Tell your health care provider if you often feel depressed.  Tell your health care provider if you have ever been abused or do not feel safe at home. This information is not intended to replace advice given to you by your health care provider. Make sure you discuss any questions you have with your health care provider. Document Released: 07/31/2010 Document Revised: 06/23/2015 Document Reviewed: 10/19/2014 Elsevier Interactive Patient Education  2018 [employer]  

## 2017-08-06 NOTE — Progress Notes (Signed)
   Subjective:    Patient ID: Latoya Reyes, female    DOB: 04/26/1955, 62 y.o.   MRN: [id number]  HPI The patient is a 62 YO female coming in for physical. No new concerns.  [facility], social history reviewed and updated.   Review of Systems  Constitutional: Negative.   HENT: Negative.   Eyes: Negative.   Respiratory: Negative for cough, chest tightness and shortness of breath.   Cardiovascular: Negative for chest pain, palpitations and leg swelling.  Gastrointestinal: Negative for abdominal distention, abdominal pain, constipation, diarrhea, nausea and vomiting.  Musculoskeletal: Negative.   Skin: Negative.   Neurological: Negative.   Psychiatric/Behavioral: Negative.       Objective:   Physical Exam  Constitutional: She is oriented to person, place, and time. She appears well-developed and well-nourished.  HENT:  Head: Normocephalic and atraumatic.  Eyes: EOM are normal.  Neck: Normal range of motion.  Cardiovascular: Normal rate and regular rhythm.  Pulmonary/Chest: Effort normal and breath sounds normal. No respiratory distress. She has no wheezes. She has no rales.  Abdominal: Soft. Bowel sounds are normal. She exhibits no distension. There is no tenderness. There is no rebound.  Musculoskeletal: She exhibits no edema.  Neurological: She is alert and oriented to person, place, and time. Coordination normal.  Skin: Skin is warm and dry.  Psychiatric: She has a normal mood and affect.   Vitals:   08/06/17 0855  BP: 140/90  Pulse: 78  Temp: 97.9 F (36.6 C)  TempSrc: Oral  SpO2: 99%  Weight: 151 lb (68.5 kg)  Height: 5\' 4"  (1.626 m)      Assessment & Plan:

## 2017-08-07 LAB — HEPATITIS C ANTIBODY
Hepatitis C Ab: NONREACTIVE
SIGNAL TO CUT-OFF: 0.05 (ref <1.00)

## 2017-09-16 ENCOUNTER — Encounter: Payer: Self-pay | Admitting: Internal Medicine

## 2017-09-16 NOTE — Progress Notes (Unsigned)
Abstracted and sent to scan  

## 2017-10-04 ENCOUNTER — Ambulatory Visit: Payer: Self-pay

## 2017-10-04 ENCOUNTER — Encounter: Payer: Self-pay | Admitting: Family Medicine

## 2017-10-04 ENCOUNTER — Ambulatory Visit: Payer: BC Managed Care – PPO | Admitting: Family Medicine

## 2017-10-04 VITALS — BP 128/64 | HR 89 | Ht 64.0 in | Wt 152.0 lb

## 2017-10-04 DIAGNOSIS — M25532 Pain in left wrist: Secondary | ICD-10-CM

## 2017-10-04 MED ORDER — PREDNISONE 5 MG PO TABS: ORAL_TABLET | ORAL | 0 refills | Status: DC

## 2017-10-04 NOTE — Patient Instructions (Signed)
Nice to meet you  Please try the medication Please try the exercises  Please follow up with me in 2-3 weeks if your symptoms are not improving.

## 2017-10-04 NOTE — Progress Notes (Signed)
Latoya Reyes - 62 y.o. female MRN [id number]  Date of birth: 08-Jan-1956  SUBJECTIVE:  Including CC & ROS.  Chief Complaint  Patient presents with  . Wrist Pain    Latoya Reyes is a 62 y.o. female that is presenting with left hand and wrist pain. Ongoing for two weeks. Located midportion palm of her and and radiates to her wrist. Denies injury or trauma. Pain is mild, she feels the pain radiates to her middle and ring fingers. Admits to tingling. She has been taking motrin for the pain.     Review of Systems  Constitutional: Negative for fever.  HENT: Negative for congestion.   Respiratory: Negative for cough.   Cardiovascular: Negative for chest pain.  Gastrointestinal: Negative for abdominal pain.  Musculoskeletal: Negative for gait problem.  Skin: Negative for color change.  Neurological: Negative for numbness.  Hematological: Negative for adenopathy.  Psychiatric/Behavioral: Negative for agitation.    HISTORY: Past Medical, Surgical, Social, and Family History Reviewed & Updated per EMR.   Pertinent Historical Findings include:  Past Medical History:  Diagnosis Date  . Arthritis    foot; right    Past Surgical History:  Procedure Laterality Date  . FOOT SURGERY  2010   right    Allergies  Allergen Reactions  . Influenza Vaccines Hives    Family History  Problem Relation Age of Onset  . Diabetes Mother   . Kidney disease Mother   . Diabetes Father   . Heart disease Father   . Diabetes Brother   . Colon cancer Neg Hx      Social History   Socioeconomic History  . Marital status: Single    Spouse name: Not on file  . Number of children: Not on file  . Years of education: Not on file  . Highest education level: Not on file  Occupational History  . Not on file  Social Needs  . Financial resource strain: Not on file  . Food insecurity:    Worry: Not on file    Inability: Not on file  . Transportation needs:    Medical: Not on file    Non-medical:  Not on file  Tobacco Use  . Smoking status: Never Smoker  . Smokeless tobacco: Never Used  Substance and Sexual Activity  . Alcohol use: No  . Drug use: No  . Sexual activity: Not on file  Lifestyle  . Physical activity:    Days per week: Not on file    Minutes per session: Not on file  . Stress: Not on file  Relationships  . Social connections:    Talks on phone: Not on file    Gets together: Not on file    Attends religious service: Not on file    Active member of club or organization: Not on file    Attends meetings of clubs or organizations: Not on file    Relationship status: Not on file  . Intimate partner violence:    Fear of current or ex partner: Not on file    Emotionally abused: Not on file    Physically abused: Not on file    Forced sexual activity: Not on file  Other Topics Concern  . Not on file  Social History Narrative  . Not on file     PHYSICAL EXAM:  VS: BP 128/64   Pulse 89   Ht 5\' 4"  (1.626 m)   Wt 152 lb (68.9 kg)   SpO2 98%  BMI 26.09 kg/m  Physical Exam Gen: NAD, alert, cooperative with exam, well-appearing ENT: normal lips, normal nasal mucosa,  Eye: normal EOM, normal conjunctiva and lids CV:  no edema, +2 pedal pulses   Resp: no accessory muscle use, non-labored,  Skin: no rashes, no areas of induration  Neuro: normal tone, normal sensation to touch Psych:  normal insight, alert and oriented MSK:  Left hand:  No effusion or ecchymosis. No signs of atrophy. Normal pincer grasp. Normal finger abduction and abduction strength resistance. Negative Finkelstein's test. Negative Tinel's test. Normal grip strength. Normal thumb opposition and flexion extension. Neurovascular intact  Limited ultrasound: Left wrist:  Normal-appearing median nerve.  Normal-appearing dynamic testing of the median nerve Normal-appearing first dorsal compartment. Normal scaphoid  Summary: Normal exam  Ultrasound and interpretation by Clare Gandy,  MD      ASSESSMENT & PLAN:   Left wrist pain Possibly be component of carpal tunnel syndrome.  Does not appear to be arthritic in nature.  The ultrasound did not suggest a significantly swollen or irritated median nerve.  Does not appear to be de Quervain's. -Prednisone -Counseled on home exercise therapy. -Counseled that she should avoid provocative maneuvers until her symptoms have dissipated -If no improvement consider imaging versus injection versus physical therapy.

## 2017-10-07 DIAGNOSIS — M25532 Pain in left wrist: Secondary | ICD-10-CM | POA: Insufficient documentation

## 2017-10-07 NOTE — Assessment & Plan Note (Signed)
Possibly be component of carpal tunnel syndrome.  Does not appear to be arthritic in nature.  The ultrasound did not suggest a significantly swollen or irritated median nerve.  Does not appear to be de Quervain's. -Prednisone -Counseled on home exercise therapy. -Counseled that she should avoid provocative maneuvers until her symptoms have dissipated -If no improvement consider imaging versus injection versus physical therapy.

## 2018-10-31 LAB — HM MAMMOGRAPHY

## 2018-11-04 ENCOUNTER — Other Ambulatory Visit: Payer: Self-pay | Admitting: Obstetrics and Gynecology

## 2018-11-04 ENCOUNTER — Encounter: Payer: Self-pay | Admitting: Internal Medicine

## 2018-11-04 DIAGNOSIS — E2839 Other primary ovarian failure: Secondary | ICD-10-CM

## 2018-11-04 NOTE — Progress Notes (Signed)
Abstracted and sent to scan  

## 2019-01-26 ENCOUNTER — Ambulatory Visit
Admission: RE | Admit: 2019-01-26 | Discharge: 2019-01-26 | Disposition: A | Discharge status: Home / Self Care | Payer: Medicare Other | Source: Ambulatory Visit | Attending: Obstetrics and Gynecology | Admitting: Obstetrics and Gynecology

## 2019-01-26 ENCOUNTER — Other Ambulatory Visit: Payer: Self-pay

## 2019-01-26 DIAGNOSIS — E2839 Other primary ovarian failure: Secondary | ICD-10-CM

## 2020-01-04 ENCOUNTER — Ambulatory Visit (INDEPENDENT_AMBULATORY_CARE_PROVIDER_SITE_OTHER): Payer: Medicare PPO | Admitting: Internal Medicine

## 2020-01-04 ENCOUNTER — Other Ambulatory Visit: Payer: Self-pay

## 2020-01-04 ENCOUNTER — Encounter: Payer: Self-pay | Admitting: Internal Medicine

## 2020-01-04 VITALS — BP 130/80 | HR 88 | Temp 98.8°F | Ht 64.0 in | Wt 154.0 lb

## 2020-01-04 DIAGNOSIS — Z0000 Encounter for general adult medical examination without abnormal findings: Secondary | ICD-10-CM

## 2020-01-04 DIAGNOSIS — Z23 Encounter for immunization: Secondary | ICD-10-CM

## 2020-01-04 LAB — COMPREHENSIVE METABOLIC PANEL
ALT: 12 U/L (ref 0–35)
AST: 18 U/L (ref 0–37)
Albumin: 4.2 g/dL (ref 3.5–5.2)
Alkaline Phosphatase: 69 U/L (ref 39–117)
BUN: 12 mg/dL (ref 6–23)
CO2: 29 mEq/L (ref 19–32)
Calcium: 9.5 mg/dL (ref 8.4–10.5)
Chloride: 105 mEq/L (ref 96–112)
Creatinine, Ser: 0.88 mg/dL (ref 0.40–1.20)
GFR: 68.57 mL/min (ref >60.00)
Glucose, Bld: 107 mg/dL — ABNORMAL HIGH (ref 70–99)
Potassium: 3.6 mEq/L (ref 3.5–5.1)
Sodium: 140 mEq/L (ref 135–145)
Total Bilirubin: 0.3 mg/dL (ref 0.2–1.2)
Total Protein: 6.8 g/dL (ref 6.0–8.3)

## 2020-01-04 LAB — LIPID PANEL
Cholesterol: 187 mg/dL (ref 0–200)
HDL: 62.1 mg/dL (ref >39.00)
LDL Cholesterol: 104 mg/dL — ABNORMAL HIGH (ref 0–99)
NonHDL: 124.83
Total CHOL/HDL Ratio: 3
Triglycerides: 103 mg/dL (ref 0.0–149.0)
VLDL: 20.6 mg/dL (ref 0.0–40.0)

## 2020-01-04 LAB — CBC
HCT: 40.2 % (ref 36.0–46.0)
Hemoglobin: 13.4 g/dL (ref 12.0–15.0)
MCHC: 33.4 g/dL (ref 30.0–36.0)
MCV: 88.8 fl (ref 78.0–100.0)
Platelets: 243 10*3/uL (ref 150.0–400.0)
RBC: 4.53 Mil/uL (ref 3.87–5.11)
RDW: 13.1 % (ref 11.5–15.5)
WBC: 13.1 10*3/uL — ABNORMAL HIGH (ref 4.0–10.5)

## 2020-01-04 MED ORDER — FLUTICASONE PROPIONATE 50 MCG/ACT NA SUSP: 2.0000 | Freq: Every day | NASAL | 3 refills | Status: DC

## 2020-01-04 NOTE — Progress Notes (Signed)
   Subjective:   Patient ID: Latoya Reyes, female    DOB: [birth year], 64 y.o.   MRN: [id number]  HPI The patient is a 64 YO female coming in for physical.   PMH, [facility], social history reviewed and updated.   Review of Systems  Constitutional: Negative.   HENT: Negative.   Eyes: Negative.   Respiratory: Negative for cough, chest tightness and shortness of breath.   Cardiovascular: Negative for chest pain, palpitations and leg swelling.  Gastrointestinal: Negative for abdominal distention, abdominal pain, constipation, diarrhea, nausea and vomiting.  Musculoskeletal: Negative.   Skin: Negative.   Neurological: Negative.   Psychiatric/Behavioral: Negative.     Objective:  Physical Exam Constitutional:      Appearance: She is well-developed.  HENT:     Head: Normocephalic and atraumatic.  Cardiovascular:     Rate and Rhythm: Normal rate and regular rhythm.  Pulmonary:     Effort: Pulmonary effort is normal. No respiratory distress.     Breath sounds: Normal breath sounds. No wheezing or rales.  Abdominal:     General: Bowel sounds are normal. There is no distension.     Palpations: Abdomen is soft.     Tenderness: There is no abdominal tenderness. There is no rebound.  Musculoskeletal:     Cervical back: Normal range of motion.  Skin:    General: Skin is warm and dry.  Neurological:     Mental Status: She is alert and oriented to person, place, and time.     Coordination: Coordination normal.     Vitals:   01/04/20 1417  BP: 130/80  Pulse: 88  Temp: 98.8 F (37.1 C)  TempSrc: Oral  SpO2: 99%  Weight: 154 lb (69.9 kg)  Height: 5\' 4"  (1.626 m)   EKG: Rate 66, axis normal, interval normal, sinus, no st or t wave changes, no prior to compare  This visit occurred during the SARS-CoV-2 public health emergency.  Safety protocols were in place, including screening questions prior to the visit, additional usage of staff PPE, and extensive cleaning of exam room while  observing appropriate contact time as indicated for disinfecting solutions.   Assessment & Plan:  Pneumonia 23 given at visit

## 2020-01-04 NOTE — Patient Instructions (Addendum)
Your EKG is normal and we have given you the pneumonia shot today.   Health Maintenance, Female Adopting a healthy lifestyle and getting preventive care are important in promoting health and wellness. Ask your health care provider about:  The right schedule for you to have regular tests and exams.  Things you can do on your own to prevent diseases and keep yourself healthy. What should I know about diet, weight, and exercise? Eat a healthy diet   Eat a diet that includes plenty of vegetables, fruits, low-fat dairy products, and lean protein.  Do not eat a lot of foods that are high in solid fats, added sugars, or sodium. Maintain a healthy weight Body mass index (BMI) is used to identify weight problems. It estimates body fat based on height and weight. Your health care provider can help determine your BMI and help you achieve or maintain a healthy weight. Get regular exercise Get regular exercise. This is one of the most important things you can do for your health. Most adults should:  Exercise for at least 150 minutes each week. The exercise should increase your heart rate and make you sweat (moderate-intensity exercise).  Do strengthening exercises at least twice a week. This is in addition to the moderate-intensity exercise.  Spend less time sitting. Even light physical activity can be beneficial. Watch cholesterol and blood lipids Have your blood tested for lipids and cholesterol at 66 years of age, then have this test every 5 years. Have your cholesterol levels checked more often if:  Your lipid or cholesterol levels are high.  You are older than 66 years of age.  You are at high risk for heart disease. What should I know about cancer screening? Depending on your health history and family history, you may need to have cancer screening at various ages. This may include screening for:  Breast cancer.  Cervical cancer.  Colorectal cancer.  Skin cancer.  Lung  cancer. What should I know about heart disease, diabetes, and high blood pressure? Blood pressure and heart disease  High blood pressure causes heart disease and increases the risk of stroke. This is more likely to develop in people who have high blood pressure readings, are of African descent, or are overweight.  Have your blood pressure checked: ? Every 3-5 years if you are 39-62 years of age. ? Every year if you are 90 years old or older. Diabetes Have regular diabetes screenings. This checks your fasting blood sugar level. Have the screening done:  Once every three years after age 3 if you are at a normal weight and have a low risk for diabetes.  More often and at a younger age if you are overweight or have a high risk for diabetes. What should I know about preventing infection? Hepatitis B If you have a higher risk for hepatitis B, you should be screened for this virus. Talk with your health care provider to find out if you are at risk for hepatitis B infection. Hepatitis C Testing is recommended for:  Everyone born from [birth year] through 1967.  Anyone with known risk factors for hepatitis C. Sexually transmitted infections (STIs)  Get screened for STIs, including gonorrhea and chlamydia, if: ? You are sexually active and are younger than 66 years of age. ? You are older than 66 years of age and your health care provider tells you that you are at risk for this type of infection. ? Your sexual activity has changed since you were last screened, and  you are at increased risk for chlamydia or gonorrhea. Ask your health care provider if you are at risk.  Ask your health care provider about whether you are at high risk for HIV. Your health care provider may recommend a prescription medicine to help prevent HIV infection. If you choose to take medicine to prevent HIV, you should first get tested for HIV. You should then be tested every 3 months for as long as you are taking the  medicine. Pregnancy  If you are about to stop having your period (premenopausal) and you may become pregnant, seek counseling before you get pregnant.  Take 400 to 800 micrograms (mcg) of folic acid every day if you become pregnant.  Ask for birth control (contraception) if you want to prevent pregnancy. Osteoporosis and menopause Osteoporosis is a disease in which the bones lose minerals and strength with aging. This can result in bone fractures. If you are 38 years old or older, or if you are at risk for osteoporosis and fractures, ask your health care provider if you should:  Be screened for bone loss.  Take a calcium or vitamin D supplement to lower your risk of fractures.  Be given hormone replacement therapy (HRT) to treat symptoms of menopause. Follow these instructions at home: Lifestyle  Do not use any products that contain nicotine or tobacco, such as cigarettes, e-cigarettes, and chewing tobacco. If you need help quitting, ask your health care provider.  Do not use street drugs.  Do not share needles.  Ask your health care provider for help if you need support or information about quitting drugs. Alcohol use  Do not drink alcohol if: ? Your health care provider tells you not to drink. ? You are pregnant, may be pregnant, or are planning to become pregnant.  If you drink alcohol: ? Limit how much you use to 0-1 drink a day. ? Limit intake if you are breastfeeding.  Be aware of how much alcohol is in your drink. In the [place] one drink equals one 12 oz bottle of beer (355 mL), one 5 oz glass of wine (148 mL), or one 1 oz glass of hard liquor (44 mL). General instructions  Schedule regular health, dental, and eye exams.  Stay current with your vaccines.  Tell your health care provider if: ? You often feel depressed. ? You have ever been abused or do not feel safe at home. Summary  Adopting a healthy lifestyle and getting preventive care are important in  promoting health and wellness.  Follow your health care provider's instructions about healthy diet, exercising, and getting tested or screened for diseases.  Follow your health care provider's instructions on monitoring your cholesterol and blood pressure. This information is not intended to replace advice given to you by your health care provider. Make sure you discuss any questions you have with your health care provider. Document Revised: 01/08/2018 Document Reviewed: 01/08/2018 Elsevier Patient Education  2020 [employer].

## 2020-01-04 NOTE — Assessment & Plan Note (Signed)
Flu shot up to date. Covid-19 up to date including booster. Pneumonia given 23 to complete series. Shingrix done does not know dates. Tetanus due 2022. Colonoscopy due 2024. Mammogram due 2022, pap smear aged out and dexa due 2023. Counseled about sun safety and mole surveillance. Counseled about the dangers of distracted driving. Given 10 year screening recommendations.

## 2020-09-15 DIAGNOSIS — Z8249 Family history of ischemic heart disease and other diseases of the circulatory system: Secondary | ICD-10-CM | POA: Diagnosis not present

## 2020-09-15 DIAGNOSIS — Z79899 Other long term (current) drug therapy: Secondary | ICD-10-CM | POA: Diagnosis not present

## 2020-09-15 DIAGNOSIS — Z833 Family history of diabetes mellitus: Secondary | ICD-10-CM | POA: Diagnosis not present

## 2020-09-15 DIAGNOSIS — R030 Elevated blood-pressure reading, without diagnosis of hypertension: Secondary | ICD-10-CM | POA: Diagnosis not present

## 2020-09-15 DIAGNOSIS — J309 Allergic rhinitis, unspecified: Secondary | ICD-10-CM | POA: Diagnosis not present

## 2020-09-15 DIAGNOSIS — Z823 Family history of stroke: Secondary | ICD-10-CM | POA: Diagnosis not present

## 2020-11-21 DIAGNOSIS — H43813 Vitreous degeneration, bilateral: Secondary | ICD-10-CM | POA: Diagnosis not present

## 2021-02-28 ENCOUNTER — Ambulatory Visit (INDEPENDENT_AMBULATORY_CARE_PROVIDER_SITE_OTHER): Payer: Medicare PPO | Admitting: Internal Medicine

## 2021-02-28 ENCOUNTER — Encounter: Payer: Self-pay | Admitting: Internal Medicine

## 2021-02-28 ENCOUNTER — Other Ambulatory Visit: Payer: Self-pay

## 2021-02-28 VITALS — BP 128/82 | HR 73 | Temp 98.3°F | Resp 18 | Ht 64.0 in | Wt 153.6 lb

## 2021-02-28 DIAGNOSIS — Z136 Encounter for screening for cardiovascular disorders: Secondary | ICD-10-CM

## 2021-02-28 DIAGNOSIS — Z0000 Encounter for general adult medical examination without abnormal findings: Secondary | ICD-10-CM | POA: Diagnosis not present

## 2021-02-28 LAB — CBC
HCT: 41.8 % (ref 36.0–46.0)
Hemoglobin: 13.4 g/dL (ref 12.0–15.0)
MCHC: 32.1 g/dL (ref 30.0–36.0)
MCV: 89.6 fl (ref 78.0–100.0)
Platelets: 254 10*3/uL (ref 150.0–400.0)
RBC: 4.67 Mil/uL (ref 3.87–5.11)
RDW: 13.4 % (ref 11.5–15.5)
WBC: 14.4 10*3/uL — ABNORMAL HIGH (ref 4.0–10.5)

## 2021-02-28 LAB — COMPREHENSIVE METABOLIC PANEL
ALT: 16 U/L (ref 0–35)
AST: 22 U/L (ref 0–37)
Albumin: 4.3 g/dL (ref 3.5–5.2)
Alkaline Phosphatase: 78 U/L (ref 39–117)
BUN: 10 mg/dL (ref 6–23)
CO2: 31 mEq/L (ref 19–32)
Calcium: 9.8 mg/dL (ref 8.4–10.5)
Chloride: 104 mEq/L (ref 96–112)
Creatinine, Ser: 0.82 mg/dL (ref 0.40–1.20)
GFR: 74.03 mL/min (ref >60.00)
Glucose, Bld: 90 mg/dL (ref 70–99)
Potassium: 3.8 mEq/L (ref 3.5–5.1)
Sodium: 140 mEq/L (ref 135–145)
Total Bilirubin: 0.3 mg/dL (ref 0.2–1.2)
Total Protein: 7.1 g/dL (ref 6.0–8.3)

## 2021-02-28 LAB — LIPID PANEL
Cholesterol: 179 mg/dL (ref 0–200)
HDL: 56.1 mg/dL (ref >39.00)
NonHDL: 123.08
Total CHOL/HDL Ratio: 3
Triglycerides: 208 mg/dL — ABNORMAL HIGH (ref 0.0–149.0)
VLDL: 41.6 mg/dL — ABNORMAL HIGH (ref 0.0–40.0)

## 2021-02-28 LAB — LDL CHOLESTEROL, DIRECT: Direct LDL: 93 mg/dL

## 2021-02-28 NOTE — Progress Notes (Signed)
Subjective:   Patient ID: Latoya Reyes, female    DOB: 1955-07-27, 66 y.o.   MRN: [id number]  HPI Here for medicare wellness and physical, no new complaints. Please see A/P for status and treatment of chronic medical problems.   Diet: heart healthy Physical activity: active gym most days Depression/mood screen: negative Hearing: intact to whispered voice Visual acuity: grossly normal, performs annual eye exam  ADLs: capable Fall risk: none Home safety: good Cognitive evaluation: intact to orientation, naming, recall and repetition EOL planning: adv directives discussed  Flowsheet Row Office Visit from 02/28/2021 in [facility] Total Score 0        No flowsheet data found.  I have personally reviewed and have noted 1. The patient's medical and social history - reviewed today no changes 2. Their use of alcohol, tobacco or illicit drugs 3. Their current medications and supplements 4. The patient's functional ability including ADL's, fall risks, home safety risks and hearing or visual impairment. 5. Diet and physical activities 6. Evidence for depression or mood disorders 7. Care team reviewed and updated 8.  The patient is not on an opioid pain medication.  Patient Care Team: Myrlene Broker, MD as PCP - General (Internal Medicine) Past Medical History:  Diagnosis Date   Arthritis    foot; right   Past Surgical History:  Procedure Laterality Date   FOOT SURGERY  2010   right   Family History  Problem Relation Age of Onset   Diabetes Mother    Kidney disease Mother    Diabetes Father    Heart disease Father    Diabetes Brother    Colon cancer Neg Hx    Review of Systems  Constitutional: Negative.   HENT: Negative.    Eyes: Negative.   Respiratory:  Negative for cough, chest tightness and shortness of breath.   Cardiovascular:  Negative for chest pain, palpitations and leg swelling.  Gastrointestinal:  Negative for  abdominal distention, abdominal pain, constipation, diarrhea, nausea and vomiting.  Musculoskeletal: Negative.   Skin: Negative.   Neurological: Negative.   Psychiatric/Behavioral: Negative.     Objective:  Physical Exam Constitutional:      Appearance: She is well-developed.  HENT:     Head: Normocephalic and atraumatic.  Cardiovascular:     Rate and Rhythm: Normal rate and regular rhythm.  Pulmonary:     Effort: Pulmonary effort is normal. No respiratory distress.     Breath sounds: Normal breath sounds. No wheezing or rales.  Abdominal:     General: Bowel sounds are normal. There is no distension.     Palpations: Abdomen is soft.     Tenderness: There is no abdominal tenderness. There is no rebound.  Musculoskeletal:     Cervical back: Normal range of motion.  Skin:    General: Skin is warm and dry.  Neurological:     Mental Status: She is alert and oriented to person, place, and time.     Coordination: Coordination normal.    Vitals:   02/28/21 1258  BP: 128/82  Pulse: 73  Resp: 18  Temp: 98.3 F (36.8 C)  TempSrc: Oral  SpO2: 98%  Weight: 153 lb 9.6 oz (69.7 kg)  Height: 5\' 4"  (1.626 m)   This visit occurred during the SARS-CoV-2 public health emergency.  Safety protocols were in place, including screening questions prior to the visit, additional usage of staff PPE, and extensive cleaning of exam room while observing appropriate  contact time as indicated for disinfecting solutions.   Assessment & Plan:

## 2021-02-28 NOTE — Patient Instructions (Addendum)
Make sure to get the mammogram.   Try pepcid a couple times a week to see if this helps with swallowing.

## 2021-02-28 NOTE — Assessment & Plan Note (Signed)
Flu shot up to date. Covid-19 up to date. Pneumonia complete. Shingrix complete she did not know dates. Tetanus due advised to get at pharmacy. Colonoscopy due 2024. Mammogram due at gyn she has scheduled, pap smear aged out and dexa due 2025. Counseled about sun safety and mole surveillance. Counseled about the dangers of distracted driving. Given 10 year screening recommendations.

## 2021-03-01 ENCOUNTER — Other Ambulatory Visit: Payer: Self-pay | Admitting: Internal Medicine

## 2021-03-01 DIAGNOSIS — D72829 Elevated white blood cell count, unspecified: Secondary | ICD-10-CM

## 2021-03-27 ENCOUNTER — Other Ambulatory Visit: Payer: Self-pay | Admitting: Internal Medicine

## 2021-04-21 DIAGNOSIS — Z124 Encounter for screening for malignant neoplasm of cervix: Secondary | ICD-10-CM | POA: Diagnosis not present

## 2021-05-09 ENCOUNTER — Other Ambulatory Visit: Payer: Self-pay | Admitting: Obstetrics and Gynecology

## 2021-05-09 DIAGNOSIS — M8580 Other specified disorders of bone density and structure, unspecified site: Secondary | ICD-10-CM

## 2021-05-10 DIAGNOSIS — Z1231 Encounter for screening mammogram for malignant neoplasm of breast: Secondary | ICD-10-CM | POA: Diagnosis not present

## 2021-05-12 ENCOUNTER — Other Ambulatory Visit: Payer: Self-pay | Admitting: Obstetrics and Gynecology

## 2021-05-12 DIAGNOSIS — R928 Other abnormal and inconclusive findings on diagnostic imaging of breast: Secondary | ICD-10-CM

## 2021-05-15 DIAGNOSIS — R928 Other abnormal and inconclusive findings on diagnostic imaging of breast: Secondary | ICD-10-CM | POA: Insufficient documentation

## 2021-05-29 ENCOUNTER — Ambulatory Visit: Payer: Medicare PPO

## 2021-05-29 ENCOUNTER — Ambulatory Visit
Admission: RE | Admit: 2021-05-29 | Discharge: 2021-05-29 | Disposition: A | Discharge status: Home / Self Care | Payer: Medicare PPO | Source: Ambulatory Visit | Attending: Obstetrics and Gynecology | Admitting: Obstetrics and Gynecology

## 2021-05-29 DIAGNOSIS — R928 Other abnormal and inconclusive findings on diagnostic imaging of breast: Secondary | ICD-10-CM

## 2021-05-29 DIAGNOSIS — R922 Inconclusive mammogram: Secondary | ICD-10-CM | POA: Diagnosis not present

## 2021-11-01 ENCOUNTER — Ambulatory Visit
Admission: RE | Admit: 2021-11-01 | Discharge: 2021-11-01 | Disposition: A | Discharge status: Home / Self Care | Payer: Medicare PPO | Source: Ambulatory Visit | Attending: Obstetrics and Gynecology | Admitting: Obstetrics and Gynecology

## 2021-11-01 DIAGNOSIS — M8580 Other specified disorders of bone density and structure, unspecified site: Secondary | ICD-10-CM

## 2021-11-01 DIAGNOSIS — M8588 Other specified disorders of bone density and structure, other site: Secondary | ICD-10-CM | POA: Diagnosis not present

## 2021-11-01 DIAGNOSIS — M810 Age-related osteoporosis without current pathological fracture: Secondary | ICD-10-CM | POA: Diagnosis not present

## 2021-11-01 DIAGNOSIS — Z780 Asymptomatic menopausal state: Secondary | ICD-10-CM | POA: Diagnosis not present

## 2021-11-24 DIAGNOSIS — M810 Age-related osteoporosis without current pathological fracture: Secondary | ICD-10-CM | POA: Diagnosis not present

## 2021-12-18 DIAGNOSIS — H109 Unspecified conjunctivitis: Secondary | ICD-10-CM | POA: Diagnosis not present

## 2021-12-18 DIAGNOSIS — J00 Acute nasopharyngitis [common cold]: Secondary | ICD-10-CM | POA: Diagnosis not present

## 2021-12-18 DIAGNOSIS — Z20822 Contact with and (suspected) exposure to covid-19: Secondary | ICD-10-CM | POA: Diagnosis not present

## 2021-12-20 DIAGNOSIS — H1033 Unspecified acute conjunctivitis, bilateral: Secondary | ICD-10-CM | POA: Diagnosis not present

## 2021-12-25 DIAGNOSIS — H1033 Unspecified acute conjunctivitis, bilateral: Secondary | ICD-10-CM | POA: Diagnosis not present

## 2022-02-11 DIAGNOSIS — J101 Influenza due to other identified influenza virus with other respiratory manifestations: Secondary | ICD-10-CM | POA: Diagnosis not present

## 2022-02-11 DIAGNOSIS — R051 Acute cough: Secondary | ICD-10-CM | POA: Diagnosis not present

## 2022-02-14 ENCOUNTER — Telehealth: Payer: Self-pay | Admitting: Internal Medicine

## 2022-02-14 NOTE — Telephone Encounter (Signed)
N/A unable to leave a message for patient to call back to schedule Medicare Annual Wellness Visit   Last AWV  02/28/21  Please schedule at anytime with [facility]-Nurse Health Advisor if patient calls the office back.    30 Minutes appointment   Any questions, please call me at [phone] 

## 2022-03-26 ENCOUNTER — Encounter: Payer: Self-pay | Admitting: Internal Medicine

## 2022-03-26 ENCOUNTER — Ambulatory Visit (INDEPENDENT_AMBULATORY_CARE_PROVIDER_SITE_OTHER): Payer: Medicare PPO | Admitting: Internal Medicine

## 2022-03-26 VITALS — BP 122/86 | HR 85 | Temp 98.3°F | Ht 64.0 in | Wt 151.4 lb

## 2022-03-26 DIAGNOSIS — R5383 Other fatigue: Secondary | ICD-10-CM | POA: Insufficient documentation

## 2022-03-26 DIAGNOSIS — Z0000 Encounter for general adult medical examination without abnormal findings: Secondary | ICD-10-CM | POA: Diagnosis not present

## 2022-03-26 DIAGNOSIS — Z1211 Encounter for screening for malignant neoplasm of colon: Secondary | ICD-10-CM | POA: Diagnosis not present

## 2022-03-26 LAB — CBC
HCT: 42.2 % (ref 36.0–46.0)
Hemoglobin: 13.8 g/dL (ref 12.0–15.0)
MCHC: 32.8 g/dL (ref 30.0–36.0)
MCV: 90.8 fl (ref 78.0–100.0)
Platelets: 279 10*3/uL (ref 150.0–400.0)
RBC: 4.65 Mil/uL (ref 3.87–5.11)
RDW: 13.7 % (ref 11.5–15.5)
WBC: 16.7 10*3/uL — ABNORMAL HIGH (ref 4.0–10.5)

## 2022-03-26 LAB — COMPREHENSIVE METABOLIC PANEL
ALT: 13 U/L (ref 0–35)
AST: 21 U/L (ref 0–37)
Albumin: 4.1 g/dL (ref 3.5–5.2)
Alkaline Phosphatase: 70 U/L (ref 39–117)
BUN: 9 mg/dL (ref 6–23)
CO2: 27 mEq/L (ref 19–32)
Calcium: 9.9 mg/dL (ref 8.4–10.5)
Chloride: 104 mEq/L (ref 96–112)
Creatinine, Ser: 0.78 mg/dL (ref 0.40–1.20)
GFR: 78.02 mL/min (ref >60.00)
Glucose, Bld: 89 mg/dL (ref 70–99)
Potassium: 3.9 mEq/L (ref 3.5–5.1)
Sodium: 139 mEq/L (ref 135–145)
Total Bilirubin: 0.4 mg/dL (ref 0.2–1.2)
Total Protein: 7.4 g/dL (ref 6.0–8.3)

## 2022-03-26 LAB — VITAMIN D 25 HYDROXY (VIT D DEFICIENCY, FRACTURES): VITD: 31.23 ng/mL (ref 30.00–100.00)

## 2022-03-26 LAB — VITAMIN B12: Vitamin B-12: 860 pg/mL (ref 211–911)

## 2022-03-26 LAB — LIPID PANEL
Cholesterol: 195 mg/dL (ref 0–200)
HDL: 59.4 mg/dL (ref >39.00)
LDL Cholesterol: 104 mg/dL — ABNORMAL HIGH (ref 0–99)
NonHDL: 135.34
Total CHOL/HDL Ratio: 3
Triglycerides: 157 mg/dL — ABNORMAL HIGH (ref 0.0–149.0)
VLDL: 31.4 mg/dL (ref 0.0–40.0)

## 2022-03-26 LAB — TSH: TSH: 1.85 u[IU]/mL (ref 0.35–5.50)

## 2022-03-26 MED ORDER — FLUTICASONE PROPIONATE 50 MCG/ACT NA SUSP: NASAL | 3 refills | Status: DC

## 2022-03-26 NOTE — Patient Instructions (Signed)
We will send the colon cancer screening to the house.

## 2022-03-26 NOTE — Progress Notes (Signed)
Subjective:   Patient ID: Latoya Reyes, female    DOB: 01-21-1956, 67 y.o.   MRN: [id number]  HPI Here for medicare wellness and physical, no new complaints. Please see A/P for status and treatment of chronic medical problems.   Diet: heart healthy Physical activity: sedentary Depression/mood screen: negative Hearing: intact to whispered voice Visual acuity: grossly normal, performs annual eye exam  ADLs: capable Fall risk: none Home safety: good Cognitive evaluation: intact to orientation, naming, recall and repetition EOL planning: adv directives discussed  [place] Office Visit from 03/26/2022 in [place] at [place] Office Visit from 03/26/2022 in [place] at [place]  PHQ-9 Total Score 0         03/26/2022   10:09 AM  [place] in the past year? 0  Was there an injury with Fall? 0  Fall Risk Category Calculator 0  Fall risk Follow up Falls evaluation completed    I have personally reviewed and have noted 1. The patient's medical and social history - reviewed today no changes 2. Their use of alcohol, tobacco or illicit drugs 3. Their current medications and supplements 4. The patient's functional ability including ADL's, fall risks, home safety risks and hearing or visual impairment. 5. Diet and physical activities 6. Evidence for depression or mood disorders 7. Care team reviewed and updated 8.  The patient is not on an opioid pain medication  Patient Care Team: Hoyt Koch, MD as PCP - General (Internal Medicine) Past Medical History:  Diagnosis Date   Arthritis    foot; right   Past Surgical History:  Procedure Laterality Date   FOOT SURGERY  2010   right   Family History  Problem Relation Age of Onset   Diabetes Mother    Kidney disease Mother    Diabetes Father    Heart disease Father    Diabetes Brother    Colon cancer Neg  Hx    Review of Systems  Constitutional: Negative.   HENT: Negative.    Eyes: Negative.   Respiratory:  Negative for cough, chest tightness and shortness of breath.   Cardiovascular:  Negative for chest pain, palpitations and leg swelling.  Gastrointestinal:  Negative for abdominal distention, abdominal pain, constipation, diarrhea, nausea and vomiting.  Musculoskeletal: Negative.   Skin: Negative.   Neurological: Negative.   Psychiatric/Behavioral: Negative.      Objective:  Physical Exam Constitutional:      Appearance: She is well-developed.  HENT:     Head: Normocephalic and atraumatic.  Cardiovascular:     Rate and Rhythm: Normal rate and regular rhythm.  Pulmonary:     Effort: Pulmonary effort is normal. No respiratory distress.     Breath sounds: Normal breath sounds. No wheezing or rales.  Abdominal:     General: Bowel sounds are normal. There is no distension.     Palpations: Abdomen is soft.     Tenderness: There is no abdominal tenderness. There is no rebound.  Musculoskeletal:     Cervical back: Normal range of motion.  Skin:    General: Skin is warm and dry.  Neurological:     Mental Status: She is alert and oriented to person, place, and time.     Coordination: Coordination normal.     Vitals:   03/26/22 1004  BP: 122/86  Pulse: 85  Temp: 98.3 F (36.8  C)  TempSrc: Oral  SpO2: 99%  Weight: 151 lb 6 oz (68.7 kg)  Height: '5\' 4"'$  (1.626 m)    Assessment & Plan:

## 2022-03-26 NOTE — Assessment & Plan Note (Signed)
Checking TSH, B12, vitamin D likely post-viral from flu about 6 weeks ago.

## 2022-03-26 NOTE — Assessment & Plan Note (Signed)
Flu shot up to date. Pneumonia complete. Shingrix due counseled. Tetanus due counseled. Cologuard. Mammogram due with gyn, pap smear aged out and dexa complete. Counseled about sun safety and mole surveillance. Counseled about the dangers of distracted driving. Given 10 year screening recommendations.

## 2022-04-02 ENCOUNTER — Other Ambulatory Visit: Payer: Self-pay | Admitting: Internal Medicine

## 2022-04-02 DIAGNOSIS — D72829 Elevated white blood cell count, unspecified: Secondary | ICD-10-CM | POA: Insufficient documentation

## 2022-04-02 DIAGNOSIS — C9110 Chronic lymphocytic leukemia of B-cell type not having achieved remission: Secondary | ICD-10-CM | POA: Insufficient documentation

## 2022-04-09 DIAGNOSIS — Z1211 Encounter for screening for malignant neoplasm of colon: Secondary | ICD-10-CM | POA: Diagnosis not present

## 2022-04-10 ENCOUNTER — Other Ambulatory Visit (HOSPITAL_BASED_OUTPATIENT_CLINIC_OR_DEPARTMENT_OTHER): Payer: Self-pay | Admitting: Obstetrics and Gynecology

## 2022-04-10 DIAGNOSIS — M8580 Other specified disorders of bone density and structure, unspecified site: Secondary | ICD-10-CM

## 2022-04-16 DIAGNOSIS — D3131 Benign neoplasm of right choroid: Secondary | ICD-10-CM | POA: Diagnosis not present

## 2022-04-16 LAB — COLOGUARD: COLOGUARD: NEGATIVE

## 2022-05-04 ENCOUNTER — Other Ambulatory Visit (INDEPENDENT_AMBULATORY_CARE_PROVIDER_SITE_OTHER): Payer: Medicare PPO

## 2022-05-04 DIAGNOSIS — D72829 Elevated white blood cell count, unspecified: Secondary | ICD-10-CM | POA: Diagnosis not present

## 2022-05-04 LAB — CBC WITH DIFFERENTIAL/PLATELET
Basophils Absolute: 0.1 10*3/uL (ref 0.0–0.1)
Basophils Relative: 0.6 % (ref 0.0–3.0)
Eosinophils Absolute: 0.2 10*3/uL (ref 0.0–0.7)
Eosinophils Relative: 1.4 % (ref 0.0–5.0)
HCT: 42.9 % (ref 36.0–46.0)
Hemoglobin: 13.9 g/dL (ref 12.0–15.0)
Lymphocytes Relative: 73 % — ABNORMAL HIGH (ref 12.0–46.0)
Lymphs Abs: 10.9 10*3/uL — ABNORMAL HIGH (ref 0.7–4.0)
MCHC: 32.4 g/dL (ref 30.0–36.0)
MCV: 91.3 fl (ref 78.0–100.0)
Monocytes Absolute: 0.4 10*3/uL (ref 0.1–1.0)
Monocytes Relative: 2.9 % — ABNORMAL LOW (ref 3.0–12.0)
Neutro Abs: 3.3 10*3/uL (ref 1.4–7.7)
Neutrophils Relative %: 22.1 % — ABNORMAL LOW (ref 43.0–77.0)
Platelets: 258 10*3/uL (ref 150.0–400.0)
RBC: 4.7 Mil/uL (ref 3.87–5.11)
RDW: 13.5 % (ref 11.5–15.5)
WBC: 14.9 10*3/uL — ABNORMAL HIGH (ref 4.0–10.5)

## 2022-05-07 ENCOUNTER — Other Ambulatory Visit: Payer: Self-pay | Admitting: Internal Medicine

## 2022-05-07 DIAGNOSIS — D72820 Lymphocytosis (symptomatic): Secondary | ICD-10-CM

## 2022-05-07 LAB — PATHOLOGIST SMEAR REVIEW

## 2022-05-07 IMAGING — MG MM DIGITAL DIAGNOSTIC UNILAT*R* W/ TOMO W/ CAD
6 series · 6 of 18 positions shown · non-contrast
Comparison: Prior mammograms dating back to 4844.

CLINICAL DATA: 65-year-old female for further evaluation of
possible RIGHT breast asymmetry on screening mammogram.

EXAM:
DIGITAL DIAGNOSTIC UNILATERAL RIGHT MAMMOGRAM WITH TOMOSYNTHESIS AND
CAD
TECHNIQUE: Right digital diagnostic mammography and breast tomosynthesis was
performed. The images were evaluated with computer-aided detection.

[R ML synth-2D]
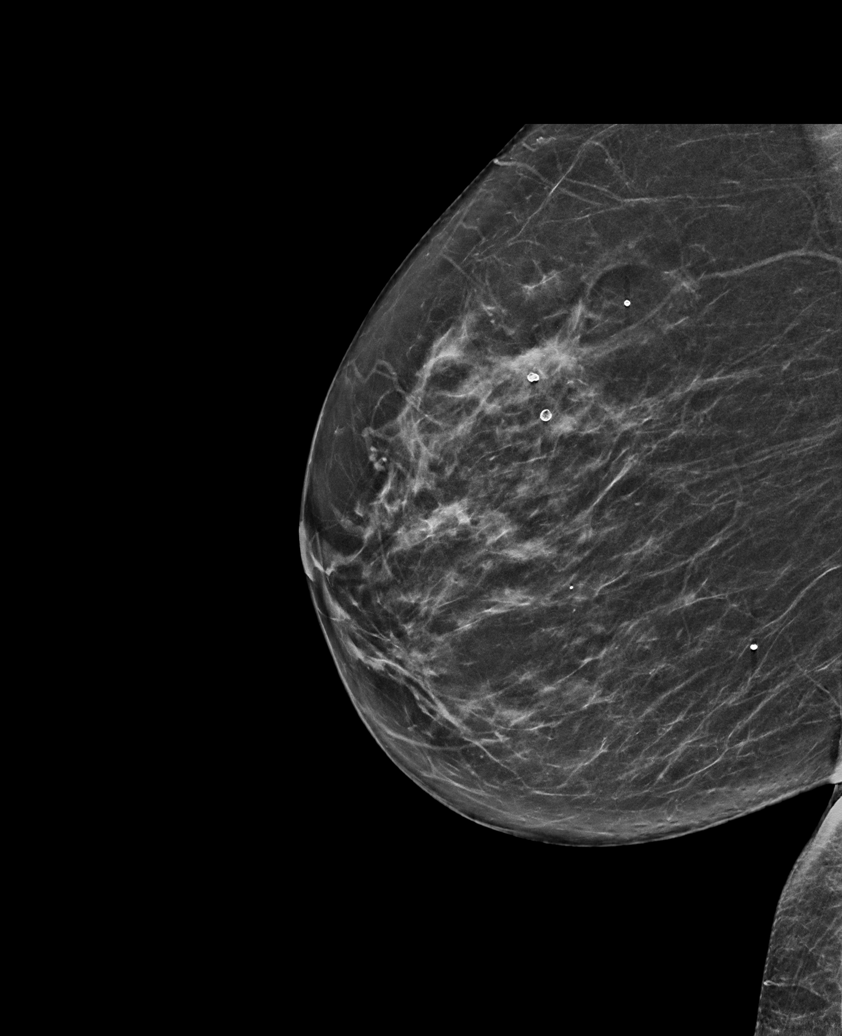

[R MLO synth-2D (1 of 2)]
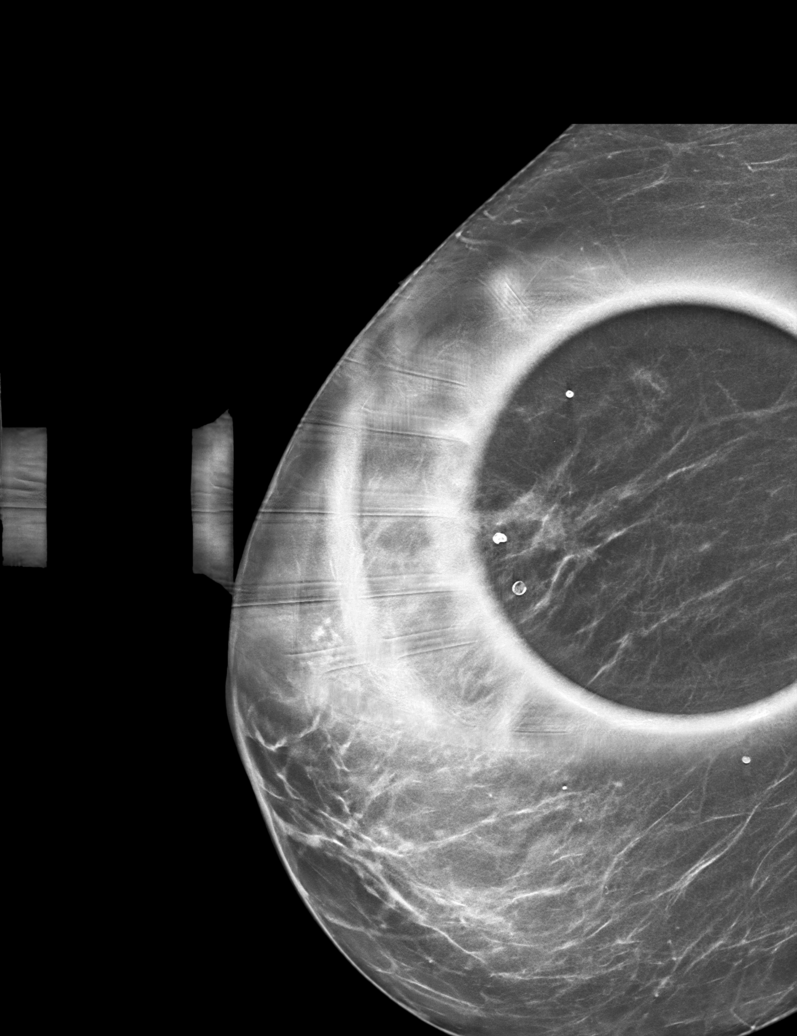

[R MLO synth-2D (2 of 2)]
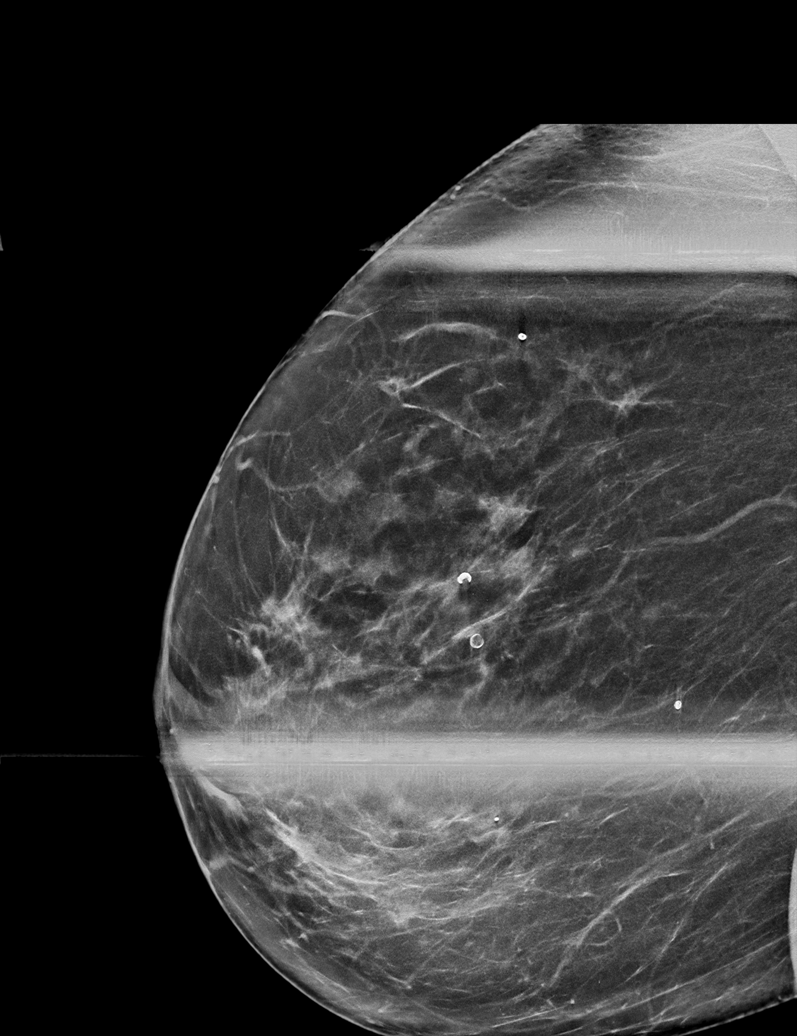

[R ML tomo · tomo slice 33/66.0]
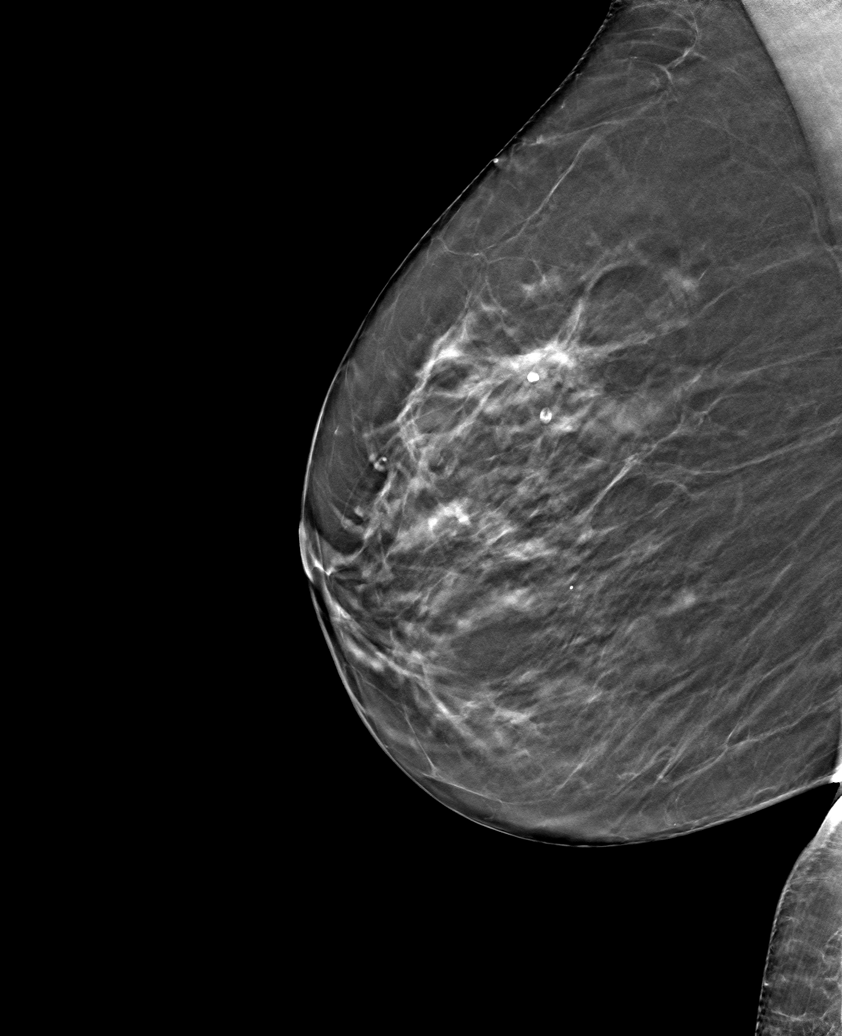

[R MLO tomo (1 of 2) · tomo slice 33/65.0]
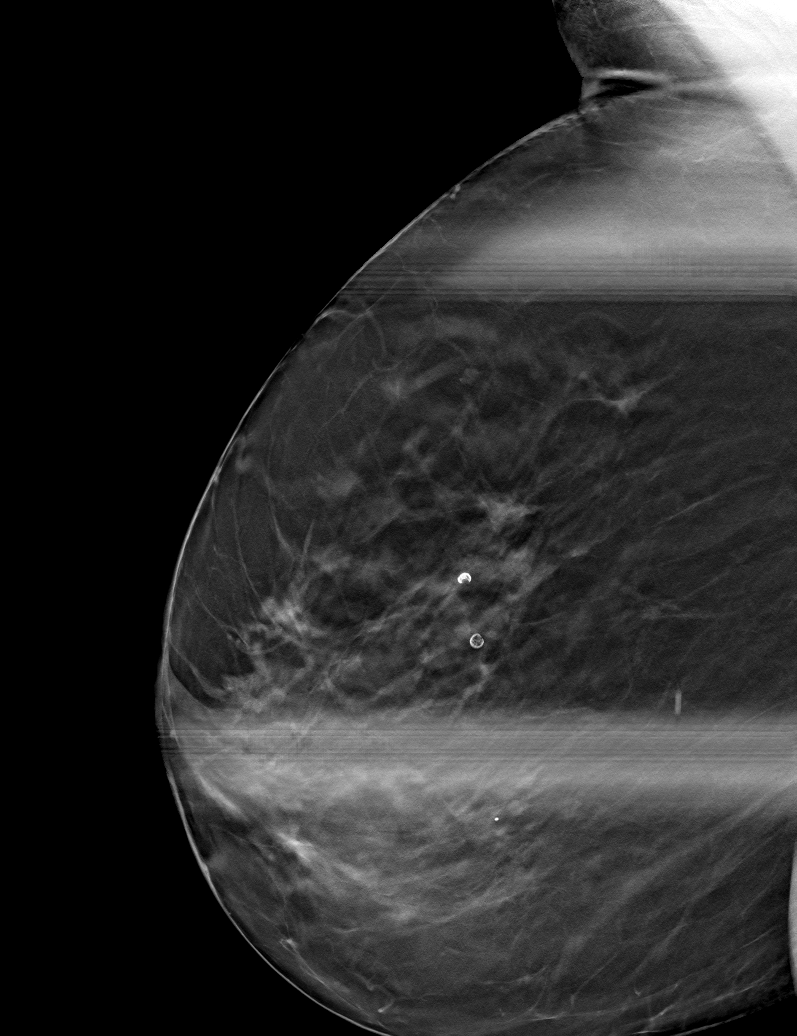

[R MLO tomo (2 of 2) · tomo slice 32/63.0]
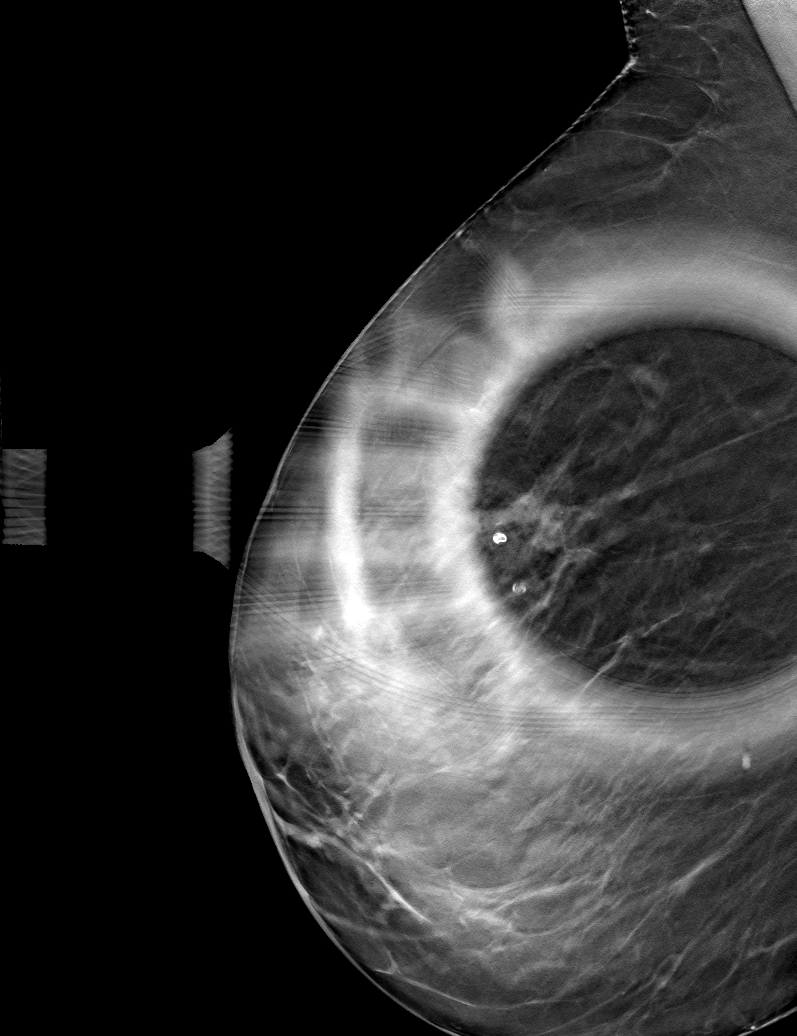

[6 of 18 positions shown; findings below may reference images not displayed]

ACR Breast Density Category c: The breast tissue is heterogeneously
dense, which may obscure small masses.
FINDINGS: 2D/3D full field and spot compression views of the RIGHT breast
demonstrate dispersal of the screening study asymmetry without
persistent suspicious abnormality in this area. On today's images,
this area has a similar appearance to remote studies.
IMPRESSION: No persistent suspicious abnormality at the site of the screening
study finding.

RECOMMENDATION:
Bilateral screening mammogram in 1 year.

I have discussed the findings and recommendations with the patient.
If applicable, a reminder letter will be sent to the patient
regarding the next appointment.

BI-RADS CATEGORY  1: Negative.

## 2022-05-08 ENCOUNTER — Telehealth: Payer: Self-pay | Admitting: Internal Medicine

## 2022-05-08 ENCOUNTER — Telehealth: Payer: Self-pay | Admitting: Hematology and Oncology

## 2022-05-08 NOTE — Telephone Encounter (Signed)
Patient returned call about lab results. She would like a call back at [phone].

## 2022-05-08 NOTE — Telephone Encounter (Signed)
scheduled per 4/9 referral , pt has been called and confirmed date and time. Pt is aware of location and to arrive early for check in

## 2022-05-15 DIAGNOSIS — Z1231 Encounter for screening mammogram for malignant neoplasm of breast: Secondary | ICD-10-CM | POA: Diagnosis not present

## 2022-05-17 ENCOUNTER — Other Ambulatory Visit: Payer: Self-pay | Admitting: Obstetrics and Gynecology

## 2022-05-17 DIAGNOSIS — R928 Other abnormal and inconclusive findings on diagnostic imaging of breast: Secondary | ICD-10-CM

## 2022-05-26 ENCOUNTER — Ambulatory Visit
Admission: RE | Admit: 2022-05-26 | Discharge: 2022-05-26 | Disposition: A | Discharge status: Home / Self Care | Payer: Medicare PPO | Source: Ambulatory Visit | Attending: Obstetrics and Gynecology | Admitting: Obstetrics and Gynecology

## 2022-05-26 ENCOUNTER — Ambulatory Visit: Admission: RE | Admit: 2022-05-26 | Payer: Medicare PPO | Source: Ambulatory Visit

## 2022-05-26 DIAGNOSIS — R928 Other abnormal and inconclusive findings on diagnostic imaging of breast: Secondary | ICD-10-CM

## 2022-05-26 DIAGNOSIS — R922 Inconclusive mammogram: Secondary | ICD-10-CM | POA: Diagnosis not present

## 2022-05-28 ENCOUNTER — Encounter: Payer: Self-pay | Admitting: Hematology and Oncology

## 2022-05-28 ENCOUNTER — Inpatient Hospital Stay: Payer: Medicare PPO

## 2022-05-28 ENCOUNTER — Inpatient Hospital Stay: Payer: Medicare PPO | Attending: Hematology and Oncology | Admitting: Hematology and Oncology

## 2022-05-28 VITALS — BP 166/75 | HR 72 | Temp 97.4°F | Resp 18 | Ht 64.0 in | Wt 153.6 lb

## 2022-05-28 DIAGNOSIS — D72820 Lymphocytosis (symptomatic): Secondary | ICD-10-CM

## 2022-05-28 DIAGNOSIS — Z8249 Family history of ischemic heart disease and other diseases of the circulatory system: Secondary | ICD-10-CM | POA: Diagnosis not present

## 2022-05-28 DIAGNOSIS — C9110 Chronic lymphocytic leukemia of B-cell type not having achieved remission: Secondary | ICD-10-CM

## 2022-05-28 DIAGNOSIS — Z833 Family history of diabetes mellitus: Secondary | ICD-10-CM | POA: Insufficient documentation

## 2022-05-28 DIAGNOSIS — R9230 Dense breasts, unspecified: Secondary | ICD-10-CM | POA: Diagnosis not present

## 2022-05-28 DIAGNOSIS — Z841 Family history of disorders of kidney and ureter: Secondary | ICD-10-CM | POA: Diagnosis not present

## 2022-05-28 LAB — CBC WITH DIFFERENTIAL (CANCER CENTER ONLY)
Abs Immature Granulocytes: 0.02 10*3/uL (ref 0.00–0.07)
Basophils Absolute: 0.1 10*3/uL (ref 0.0–0.1)
Basophils Relative: 1 %
Eosinophils Absolute: 0.2 10*3/uL (ref 0.0–0.5)
Eosinophils Relative: 1 %
HCT: 45.2 % (ref 36.0–46.0)
Hemoglobin: 14.4 g/dL (ref 12.0–15.0)
Immature Granulocytes: 0 %
Lymphocytes Relative: 71 %
Lymphs Abs: 10.5 10*3/uL — ABNORMAL HIGH (ref 0.7–4.0)
MCH: 29.8 pg (ref 26.0–34.0)
MCHC: 31.9 g/dL (ref 30.0–36.0)
MCV: 93.6 fL (ref 80.0–100.0)
Monocytes Absolute: 0.6 10*3/uL (ref 0.1–1.0)
Monocytes Relative: 4 %
Neutro Abs: 3.3 10*3/uL (ref 1.7–7.7)
Neutrophils Relative %: 23 %
Platelet Count: 242 10*3/uL (ref 150–400)
RBC: 4.83 MIL/uL (ref 3.87–5.11)
RDW: 13 % (ref 11.5–15.5)
Smear Review: NORMAL
WBC Count: 14.6 10*3/uL — ABNORMAL HIGH (ref 4.0–10.5)
nRBC: 0 % (ref 0.0–0.2)

## 2022-05-28 LAB — LACTATE DEHYDROGENASE: LDH: 142 U/L (ref 98–192)

## 2022-05-28 MED ORDER — FLUTICASONE PROPIONATE 50 MCG/ACT NA SUSP: NASAL | 3 refills | Status: DC

## 2022-05-28 NOTE — Assessment & Plan Note (Signed)
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

## 2022-05-28 NOTE — Progress Notes (Signed)
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 and less likely due to active viral infection I will send additional blood for repeat CBC, flow cytometry and prognostic marker for CLL We discussed natural history of CLL; her total white blood cell count is quite similar over the last few years We discussed that she will not likely need treatment for this I plan to see her back in 2 weeks for further follow-up  Orders Placed This Encounter  Procedures   CBC with Differential (Cancer Center Only)    Standing Status:   Future    Number of Occurrences:   1    Standing Expiration Date:   05/28/2023   Lactate dehydrogenase    Standing Status:   Future    Number of Occurrences:   1    Standing Expiration Date:   05/28/2023   FISH, CLL Prognostic Panel    Standing Status:   Future    Number of Occurrences:   1    Standing Expiration Date:   05/28/2023   QIG  (Quant. immunoglobulins  - IgG, IgA, IgM)    Standing Status:   Future    Number of Occurrences:   1    Standing Expiration Date:   05/28/2023   ZAP-70 Panel (ASR)    Standing Status:   Future    Number of Occurrences:   1    Standing Expiration Date:   05/28/2023    All questions were answered. The patient knows to call the clinic with any problems, questions or concerns. I spent 55 minutes counseling the patient face to face, counseling and review of plan of care.   Artis Delay, MD 05/28/2022 10:42 AM   CHIEF COMPLAINTS/PURPOSE OF CONSULTATION:  Chronic leukocytosis with lymphocytosis  HISTORY OF PRESENTING ILLNESS:  Latoya Reyes 67 y.o. female is here because of elevated WBC.  She was found to have abnormal CBC from routine blood draw through her primary care doctor's office We reviewed CBC dated back to 2019 She started to have mild leukocytosis since  2021 Most recently, her CBC was drawn with differential and it showed lymphocytosis She denies recent infection. The last prescription antibiotics was more than 3 months ago There is not reported symptoms of sinus congestion, cough, urinary frequency/urgency or dysuria, diarrhea, joint swelling/pain or abnormal skin rash.  She had no prior history or diagnosis of cancer. Her age appropriate screening programs are up-to-date. The patient has no prior diagnosis of autoimmune disease and was not prescribed corticosteroids related products. She denies abnormal lymphadenopathy or night sweats  MEDICAL HISTORY:  Past Medical History:  Diagnosis Date   Allergy    Arthritis    foot; right   Osteoporosis     SURGICAL HISTORY: Past Surgical History:  Procedure Laterality Date   FOOT SURGERY  2010   right    SOCIAL HISTORY: Social History   Socioeconomic History   Marital status: Married    Spouse name: Onalee Hua   Number of children: 1   Years of education: Not on file   Highest education level: Not on file  Occupational History   Occupation: retired Runner, broadcasting/film/video  Tobacco Use   Smoking status: Never   Smokeless tobacco: Never  Substance and Sexual Activity   Alcohol use: No   Drug use: No   Sexual activity: Not  on file  Other Topics Concern   Not on file  Social History Narrative   Not on file   Social Determinants of Health   Financial Resource Strain: Not on file  Food Insecurity: Not on file  Transportation Needs: Not on file  Physical Activity: Not on file  Stress: Not on file  Social Connections: Not on file  Intimate Partner Violence: Not on file    FAMILY HISTORY: Family History  Problem Relation Age of Onset   Diabetes Mother    Kidney disease Mother    Diabetes Father    Heart disease Father    Diabetes Brother    Colon cancer Neg Hx     ALLERGIES:  has No Known Allergies.  MEDICATIONS:  Current Outpatient Medications  Medication Sig Dispense Refill    alendronate (FOSAMAX) 70 MG tablet Take 70 mg by mouth once a week.     calcium carbonate (OS-CAL) 600 MG TABS Take 1,000 mg by mouth daily.     Cranberry-Cholecalciferol 4200-500 MG-UNIT CAPS Take by mouth daily.     fluticasone (FLONASE) 50 MCG/ACT nasal spray SPRAY 2 SPRAYS INTO EACH NOSTRIL EVERY DAY/as needed 48 mL 3   glucosamine-chondroitin 500-400 MG tablet Take 1 tablet by mouth 2 (two) times daily.      magnesium gluconate (MAGONATE) 500 MG tablet Take 500 mg by mouth daily.     Multiple Vitamin (MULTIVITAMIN) capsule Take 1 capsule by mouth daily.     Nutritional Supplements (ESTRO SUPPORT ES PO) Take by mouth.     Omega-3 Fatty Acids (FISH OIL) 1200 MG CAPS Take by mouth daily.     No current facility-administered medications for this visit.    REVIEW OF SYSTEMS:   Constitutional: Denies fevers, chills or abnormal night sweats Eyes: Denies blurriness of vision, double vision or watery eyes Ears, nose, mouth, throat, and face: Denies mucositis or sore throat Respiratory: Denies cough, dyspnea or wheezes Cardiovascular: Denies palpitation, chest discomfort or lower extremity swelling Gastrointestinal:  Denies nausea, heartburn or change in bowel habits Skin: Denies abnormal skin rashes Lymphatics: Denies new lymphadenopathy or easy bruising Neurological:Denies numbness, tingling or new weaknesses Behavioral/Psych: Mood is stable, no new changes  All other systems were reviewed with the patient and are negative.  PHYSICAL EXAMINATION: ECOG PERFORMANCE STATUS: 0 - Asymptomatic  Vitals:   05/28/22 0955  BP: (!) 166/75  Pulse: 72  Resp: 18  Temp: (!) 97.4 F (36.3 C)  SpO2: 100%   Filed Weights   05/28/22 0955  Weight: 153 lb 9.6 oz (69.7 kg)    GENERAL:alert, no distress and comfortable SKIN: skin color, texture, turgor are normal, no rashes or significant lesions EYES: normal, conjunctiva are pink and non-injected, sclera clear OROPHARYNX:no exudate, no erythema  and lips, buccal mucosa, and tongue normal  NECK: supple, thyroid normal size, non-tender, without nodularity LYMPH:  no palpable lymphadenopathy in the cervical, axillary or inguinal LUNGS: clear to auscultation and percussion with normal breathing effort HEART: regular rate & rhythm and no murmurs and no lower extremity edema ABDOMEN:abdomen soft, non-tender and normal bowel sounds Musculoskeletal:no cyanosis of digits and no clubbing  PSYCH: alert & oriented x 3 with fluent speech NEURO: no focal motor/sensory deficits  LABORATORY DATA:  I have reviewed the data as listed Recent Results (from the past 2160 hour(s))  Lipid panel     Status: Abnormal   Collection Time: 03/26/22 10:48 AM  Result Value Ref Range   Cholesterol 195 0 - 200 mg/dL  Comment: ATP III Classification       Desirable:  < 200 mg/dL               Borderline High:  200 - 239 mg/dL          High:  > = 981 mg/dL   Triglycerides 191.4 (H) 0.0 - 149.0 mg/dL    Comment: Normal:  <782 mg/dLBorderline High:  150 - 199 mg/dL   HDL 95.62 >13.08 mg/dL   VLDL 65.7 0.0 - 84.6 mg/dL   LDL Cholesterol 962 (H) 0 - 99 mg/dL   Total CHOL/HDL Ratio 3     Comment:                Men          Women1/2 Average Risk     3.4          3.3Average Risk          5.0          4.42X Average Risk          9.6          7.13X Average Risk          15.0          11.0                       NonHDL 135.34     Comment: NOTE:  Non-HDL goal should be 30 mg/dL higher than patient's LDL goal (i.e. LDL goal of < 70 mg/dL, would have non-HDL goal of < 100 mg/dL)  Comprehensive metabolic panel     Status: None   Collection Time: 03/26/22 10:48 AM  Result Value Ref Range   Sodium 139 135 - 145 mEq/L   Potassium 3.9 3.5 - 5.1 mEq/L   Chloride 104 96 - 112 mEq/L   CO2 27 19 - 32 mEq/L   Glucose, Bld 89 70 - 99 mg/dL   BUN 9 6 - 23 mg/dL   Creatinine, Ser 9.52 0.40 - 1.20 mg/dL   Total Bilirubin 0.4 0.2 - 1.2 mg/dL   Alkaline Phosphatase 70 39 - 117 U/L    AST 21 0 - 37 U/L   ALT 13 0 - 35 U/L   Total Protein 7.4 6.0 - 8.3 g/dL   Albumin 4.1 3.5 - 5.2 g/dL   GFR 84.13 >24.40 mL/min    Comment: Calculated using the CKD-EPI Creatinine Equation (2021)   Calcium 9.9 8.4 - 10.5 mg/dL  CBC     Status: Abnormal   Collection Time: 03/26/22 10:48 AM  Result Value Ref Range   WBC 16.7 (H) 4.0 - 10.5 K/uL   RBC 4.65 3.87 - 5.11 Mil/uL   Platelets 279.0 150.0 - 400.0 K/uL   Hemoglobin 13.8 12.0 - 15.0 g/dL   HCT 10.2 72.5 - 36.6 %   MCV 90.8 78.0 - 100.0 fl   MCHC 32.8 30.0 - 36.0 g/dL   RDW 44.0 34.7 - 42.5 %  Vitamin B12     Status: None   Collection Time: 03/26/22 10:48 AM  Result Value Ref Range   Vitamin B-12 860 211 - 911 pg/mL  TSH     Status: None   Collection Time: 03/26/22 10:48 AM  Result Value Ref Range   TSH 1.85 0.35 - 5.50 uIU/mL  VITAMIN D 25 Hydroxy (Vit-D Deficiency, Fractures)     Status: None   Collection Time: 03/26/22 10:48 AM  Result Value Ref Range  VITD 31.23 30.00 - 100.00 ng/mL  Cologuard     Status: None   Collection Time: 04/09/22 10:30 AM  Result Value Ref Range   COLOGUARD Negative Negative    Comment:  NEGATIVE TEST RESULT. A negative Cologuard result indicates a low likelihood that a colorectal cancer (CRC) or advanced adenoma (adenomatous polyps with more advanced pre-malignant features)  is present. The chance that a person with a negative Cologuard test has a colorectal cancer is less than 1 in 1500 (negative predictive value >99.9%) or has an  advanced adenoma is less than  5.3% (negative predictive value 94.7%). These data are based on a prospective cross-sectional study of 10,000 individuals at average risk for colorectal cancer who were screened with both Cologuard and colonoscopy. (Imperiale T. et al, N Engl J Med 2014;370(14):1286-1297) The normal value (reference range) for this assay is negative.  COLOGUARD RE-SCREENING RECOMMENDATION: Periodic colorectal cancer screening is an important part of  preventive healthcare for asymptomatic individuals at average risk for colorectal cancer.  Following a negative Cologuard result, the American Cancer Society and U.S.  Multi-Society Task Force screening guidelines recommend a Cologuard re-screening interval of 3 years.  References: American Cancer Society Guideline for Colorectal Cancer Screening: https://www.cancer.org/cancer/colon-rectal-cancer/detection-diagnosis-staging/acs-recommendations.html.; Rex DK, Boland CR, Dominitz JK, Colorectal Cancer Screening: Recommendations for Physicians and Patients from the U.S. Multi-Society Task Force on Colorectal Cancer Screening , Am J Gastroenterology 2017; 112:1016-1030.  TEST DESCRIPTION: Composite algorithmic analysis of stool DNA-biomarkers with hemoglobin immunoassay.   Quantitative values of individual biomarkers are not reportable and are not associated with individual biomarker result reference ranges. Cologuard is intended for colorectal cancer screening of adults of either sex, 45 years or older, who are at average-risk for colorectal cancer (CRC). Cologuard has been approved for use by the U.S. FDA. The performance of Cologuard was  established in a cross sectional study of average-risk adults aged 60-84. Cologuard performance in patients ages 55 to 34 years was estimated by sub-group analysis of near-age groups. Colonoscopies performed for a positive result may find as the most clinically significant lesion: colorectal cancer [4.0%], advanced adenoma (including sessile serrated polyps greater than or equal to 1cm diameter) [20%] or non- advanced adenoma [31%]; or no colorectal neoplasia [45%]. These estimates are derived from a prospective cross-sectional screening study of 10,000 individuals at average risk for colorectal cancer who were screened with both Cologuard and colonoscopy. (Imperiale T. et al, Macy Mis J Med 2014;370(14):1286-1297.) Cologuard may produce a false negative or false positive result  (no colorectal cancer or precancerous polyp present at colonoscopy follow up). A negative Cologuard test result does not guarantee the absence of CRC or advanced adenoma (pre-cancer). The current Cologuard  screening interval is every 3 years. Science writer and [place] Therapist, music). Cologuard performance data in a 10,000 patient pivotal study using colonoscopy as the reference method can be accessed at the following location: www.exactlabs.com/results. Additional description of the Cologuard test process, warnings and precautions can be found at www.cologuard.com.   Pathologist smear review     Status: None   Collection Time: 05/04/22  9:54 AM  Result Value Ref Range   Path Review      Comment: Absolute lymphocytosis and atypical lymphocytes. Suggest immunophenotyping by flow cytometry if a new/persistent finding. Review of the peripheral smear reveals adequate numbers of platelets. Elliptocytes noted. Reviewed by Nehemiah Massed Clementeen Graham, MD  (Electronic Signature on File)     05/07/2022   CBC w/Diff     Status: Abnormal  Collection Time: 05/04/22  9:54 AM  Result Value Ref Range   WBC 14.9 (H) 4.0 - 10.5 K/uL   RBC 4.70 3.87 - 5.11 Mil/uL   Hemoglobin 13.9 12.0 - 15.0 g/dL   HCT 44.0 10.2 - 72.5 %   MCV 91.3 78.0 - 100.0 fl   MCHC 32.4 30.0 - 36.0 g/dL   RDW 36.6 44.0 - 34.7 %   Platelets 258.0 150.0 - 400.0 K/uL   Neutrophils Relative % 22.1 (L) 43.0 - 77.0 %   Lymphocytes Relative 73.0 Repeated and verified X2. (H) 12.0 - 46.0 %   Monocytes Relative 2.9 (L) 3.0 - 12.0 %   Eosinophils Relative 1.4 0.0 - 5.0 %   Basophils Relative 0.6 0.0 - 3.0 %   Neutro Abs 3.3 1.4 - 7.7 K/uL   Lymphs Abs 10.9 (H) 0.7 - 4.0 K/uL   Monocytes Absolute 0.4 0.1 - 1.0 K/uL   Eosinophils Absolute 0.2 0.0 - 0.7 K/uL   Basophils Absolute 0.1 0.0 - 0.1 K/uL    RADIOGRAPHIC STUDIES: I have personally reviewed the radiological images as listed and agreed with the findings in the  report. MM 3D DIAGNOSTIC MAMMOGRAM UNILATERAL RIGHT BREAST  Result Date: 05/26/2022 CLINICAL DATA:  Screening recall for possible right breast asymmetry. EXAM: DIGITAL DIAGNOSTIC UNILATERAL RIGHT MAMMOGRAM WITH TOMOSYNTHESIS TECHNIQUE: Right digital diagnostic mammography and breast tomosynthesis was performed. COMPARISON:  Previous exam(s). ACR Breast Density Category b: There are scattered areas of fibroglandular density. FINDINGS: Additional tomograms were performed of the right breast. The initially questioned possible right breast asymmetry resolves on the additional imaging with findings compatible with an area of overlapping fibroglandular tissue. There is no mammographic evidence of malignancy in the right breast. IMPRESSION: No mammographic evidence of malignancy in the right breast. RECOMMENDATION: Screening mammogram in one year.(Code:SM-B-01Y) I have discussed the findings and recommendations with the patient. If applicable, a reminder letter will be sent to the patient regarding the next appointment. BI-RADS CATEGORY  1: Negative. Electronically Signed   By: Edwin Cap M.D.   On: 05/26/2022 10:09

## 2022-05-30 ENCOUNTER — Other Ambulatory Visit: Payer: Self-pay | Admitting: Hematology and Oncology

## 2022-05-30 ENCOUNTER — Telehealth: Payer: Self-pay

## 2022-05-30 DIAGNOSIS — C9110 Chronic lymphocytic leukemia of B-cell type not having achieved remission: Secondary | ICD-10-CM

## 2022-05-30 LAB — IGG, IGA, IGM
IgA: 193 mg/dL (ref 87–352)
IgG (Immunoglobin G), Serum: 1152 mg/dL (ref 586–1602)
IgM (Immunoglobulin M), Srm: 18 mg/dL — ABNORMAL LOW (ref 26–217)

## 2022-05-30 NOTE — Telephone Encounter (Signed)
Pt made aware per MD. She verbalized understanding and agreement.

## 2022-05-30 NOTE — Telephone Encounter (Signed)
-----   Message from Artis Delay, MD sent at 05/30/2022  1:11 PM EDT ----- Regarding: RE: add flow cytometry Looks like from email she only need to pay for lab itself Can you let her know? ----- Message ----- From: De Blanch, LPN Sent: 4/0/9811  11:39 AM EDT To: Artis Delay, MD Subject: RE: add flow cytometry                         I called Diane in the lab and she said she is not able to authorize that. She suggested I send an email to Sherral Hammers and Ezra Sites, so I will do that and copy you. Rochanda agreed to come in this Friday at 1045.  ----- Message ----- From: Artis Delay, MD Sent: 05/30/2022   9:56 AM EDT To: De Blanch, LPN Subject: RE: add flow cytometry                         Can we do that and ask the lab for no charge? It was my mistake. I thought I ordered it ----- Message ----- From: De Blanch, LPN Sent: 09/29/4780   9:15 AM EDT To: Artis Delay, MD Subject: RE: add flow cytometry                         They can not add. CBCs are refrigerated and flow cytometries can't be refrigerated. Would you like me to call pt for lab appt? ----- Message ----- From: Artis Delay, MD Sent: 05/30/2022   7:11 AM EDT To: De Blanch, LPN Subject: add flow cytometry                             HI,  I saw her on Monday I forgot to order flow cytometry; can you call lab and ask if they still have her cbc sample and add this?

## 2022-05-31 ENCOUNTER — Other Ambulatory Visit: Payer: Medicare PPO

## 2022-05-31 ENCOUNTER — Telehealth: Payer: Self-pay

## 2022-05-31 ENCOUNTER — Telehealth: Payer: Self-pay | Admitting: Hematology and Oncology

## 2022-05-31 LAB — FISH,CLL PROGNOSTIC PANEL

## 2022-05-31 LAB — ZAP-70 PANEL (ASR)

## 2022-05-31 NOTE — Telephone Encounter (Signed)
She called back. Lab appt moved to today at 3:45 pm.

## 2022-05-31 NOTE — Telephone Encounter (Signed)
Called and left a message asking her to call the office back. Asking if she can come in for labs today.

## 2022-06-01 ENCOUNTER — Inpatient Hospital Stay: Payer: Medicare PPO

## 2022-06-01 ENCOUNTER — Other Ambulatory Visit: Payer: Self-pay

## 2022-06-01 ENCOUNTER — Other Ambulatory Visit: Payer: Self-pay | Admitting: Hematology and Oncology

## 2022-06-01 ENCOUNTER — Inpatient Hospital Stay: Payer: Medicare PPO | Attending: Hematology and Oncology

## 2022-06-01 DIAGNOSIS — R9230 Dense breasts, unspecified: Secondary | ICD-10-CM | POA: Insufficient documentation

## 2022-06-01 DIAGNOSIS — D72820 Lymphocytosis (symptomatic): Secondary | ICD-10-CM

## 2022-06-01 DIAGNOSIS — C9110 Chronic lymphocytic leukemia of B-cell type not having achieved remission: Secondary | ICD-10-CM | POA: Diagnosis not present

## 2022-06-01 DIAGNOSIS — Z79899 Other long term (current) drug therapy: Secondary | ICD-10-CM | POA: Insufficient documentation

## 2022-06-01 LAB — CBC WITH DIFFERENTIAL (CANCER CENTER ONLY)
Abs Immature Granulocytes: 0.02 10*3/uL (ref 0.00–0.07)
Basophils Absolute: 0.1 10*3/uL (ref 0.0–0.1)
Basophils Relative: 1 %
Eosinophils Absolute: 0.2 10*3/uL (ref 0.0–0.5)
Eosinophils Relative: 1 %
HCT: 41.1 % (ref 36.0–46.0)
Hemoglobin: 13.5 g/dL (ref 12.0–15.0)
Immature Granulocytes: 0 %
Lymphocytes Relative: 76 %
Lymphs Abs: 12.4 10*3/uL — ABNORMAL HIGH (ref 0.7–4.0)
MCH: 30.3 pg (ref 26.0–34.0)
MCHC: 32.8 g/dL (ref 30.0–36.0)
MCV: 92.4 fL (ref 80.0–100.0)
Monocytes Absolute: 0.6 10*3/uL (ref 0.1–1.0)
Monocytes Relative: 4 %
Neutro Abs: 3 10*3/uL (ref 1.7–7.7)
Neutrophils Relative %: 18 %
Platelet Count: 233 10*3/uL (ref 150–400)
RBC: 4.45 MIL/uL (ref 3.87–5.11)
RDW: 13 % (ref 11.5–15.5)
Smear Review: NORMAL
WBC Count: 16.4 10*3/uL — ABNORMAL HIGH (ref 4.0–10.5)
nRBC: 0.1 % (ref 0.0–0.2)

## 2022-06-05 ENCOUNTER — Encounter: Payer: Self-pay | Admitting: Hematology and Oncology

## 2022-06-05 LAB — ZAP-70 PANEL (ASR)

## 2022-06-05 LAB — FLOW CYTOMETRY

## 2022-06-05 LAB — SURGICAL PATHOLOGY

## 2022-06-11 ENCOUNTER — Encounter: Payer: Self-pay | Admitting: Hematology and Oncology

## 2022-06-11 ENCOUNTER — Inpatient Hospital Stay (HOSPITAL_BASED_OUTPATIENT_CLINIC_OR_DEPARTMENT_OTHER): Payer: Medicare PPO | Admitting: Hematology and Oncology

## 2022-06-11 VITALS — BP 181/66 | HR 79 | Temp 99.1°F | Resp 18 | Ht 64.0 in | Wt 156.0 lb

## 2022-06-11 DIAGNOSIS — R9230 Dense breasts, unspecified: Secondary | ICD-10-CM | POA: Diagnosis not present

## 2022-06-11 DIAGNOSIS — C9110 Chronic lymphocytic leukemia of B-cell type not having achieved remission: Secondary | ICD-10-CM

## 2022-06-11 DIAGNOSIS — Z79899 Other long term (current) drug therapy: Secondary | ICD-10-CM | POA: Diagnosis not present

## 2022-06-11 NOTE — Progress Notes (Signed)
[facility] OFFICE PROGRESS NOTE  Patient Care Team: Myrlene Broker, MD as PCP - General (Internal Medicine)  ASSESSMENT & PLAN:  CLL (chronic lymphocytic leukemia) (HCC) I gave her copies of test results We discussed the natural history of CLL The patient is educated to watch out for signs and symptoms of disease progression I plan to see her once a year, spaced out approximately 6 months away from her primary care doctor's office visit Due to stability of her lymphocyte count, I will see her again around August 2025 Discussed importance of yearly vaccination and avoidance of excessive sun exposure  No orders of the defined types were placed in this encounter.   All questions were answered. The patient knows to call the clinic with any problems, questions or concerns. The total time spent in the appointment was 25 minutes encounter with patients including review of chart and various tests results, discussions about plan of care and coordination of care plan   Artis Delay, MD 06/11/2022 12:09 PM  INTERVAL HISTORY: Please see below for problem oriented charting. she returns for review of test results She is here accompanied by her husband I gave her copies of her flow cytometry as well as prognostic marker results  REVIEW OF SYSTEMS:   Constitutional: Denies fevers, chills or abnormal weight loss Eyes: Denies blurriness of vision Ears, nose, mouth, throat, and face: Denies mucositis or sore throat Respiratory: Denies cough, dyspnea or wheezes Cardiovascular: Denies palpitation, chest discomfort or lower extremity swelling Gastrointestinal:  Denies nausea, heartburn or change in bowel habits Skin: Denies abnormal skin rashes Lymphatics: Denies new lymphadenopathy or easy bruising Neurological:Denies numbness, tingling or new weaknesses Behavioral/Psych: Mood is stable, no new changes  All other systems were reviewed with the patient and are negative.  I  have reviewed the past medical history, past surgical history, social history and family history with the patient and they are unchanged from previous note.  ALLERGIES:  has No Known Allergies.  MEDICATIONS:  Current Outpatient Medications  Medication Sig Dispense Refill   alendronate (FOSAMAX) 70 MG tablet Take 70 mg by mouth once a week.     calcium carbonate (OS-CAL) 600 MG TABS Take 1,000 mg by mouth daily.     Cranberry-Cholecalciferol 4200-500 MG-UNIT CAPS Take by mouth daily.     fluticasone (FLONASE) 50 MCG/ACT nasal spray SPRAY 2 SPRAYS INTO EACH NOSTRIL EVERY DAY/as needed 48 mL 3   glucosamine-chondroitin 500-400 MG tablet Take 1 tablet by mouth 2 (two) times daily.      magnesium gluconate (MAGONATE) 500 MG tablet Take 500 mg by mouth daily.     Multiple Vitamin (MULTIVITAMIN) capsule Take 1 capsule by mouth daily.     Nutritional Supplements (ESTRO SUPPORT ES PO) Take by mouth.     Omega-3 Fatty Acids (FISH OIL) 1200 MG CAPS Take by mouth daily.     No current facility-administered medications for this visit.    SUMMARY OF ONCOLOGIC HISTORY: Oncology History  CLL (chronic lymphocytic leukemia) (HCC)  04/02/2022 Initial Diagnosis   CLL (chronic lymphocytic leukemia) (HCC)   06/01/2022 Pathology Results   Surgical Pathology CASE: [id number] PATIENT: Latoya Reyes Flow Pathology Report  Clinical history: Chronic lymphocytic leukemia  DIAGNOSIS:  Peripheral blood, flow cytometry: -  CD5 positive lymphoproliferative disorder (absolute number = 9.3 x 10  9/L) consistent with chronic lymphocytic leukemia.  Note: There is a lymphocytosis of predominantly small round and mature lymphocytes with a mild amount of chromatin cracking as well as  a few scattered smudge cells.  Immunophenotypically the cells are positive for CD5, CD19, CD20 and CD200 and are lambda restricted.  These findings are consistent with chronic lymphocytic leukemia; however, to fully exclude a leukemic phase  of mantle cell lymphoma peripheral blood is recommended to be sent for a CCND1-IgH FISH test, as well as a CLL prognostic panel for further workup.  GATING AND PHENOTYPIC ANALYSIS:  Gated population: Flow cytometric immunophenotyping is performed using antibodies to the antigens listed in the table below. Electronic gates are placed around a cell cluster displaying light scatter properties corresponding to: lymphocytes  Abnormal Cells in gated population: 75%  Phenotype of Abnormal Cells: CD5, CD19, CD20, CD200, Lambda                       Lymphoid Antigens       Myeloid Antigens Miscellaneous CD2  NEG  CD10 NEG  CD11b     ND   CD45 POS CD3  NEG  CD19 POS  CD11c     ND   HLA-Dr    ND CD4  NEG  CD20 POS  CD13 ND   CD34 NEG CD5  POS  CD22 ND   CD14 ND   CD38 NEG CD7  NEG  CD79b     ND   CD15 ND   CD138     ND CD8  NEG  CD103     ND   CD16 ND   TdT  ND CD25 ND   CD200     POS  CD33 ND   CD123     ND TCRab     ND   sKappa    NEG  CD64 ND   CD41 ND TCRgd     NEG  sLambda   POS  CD117     ND   CD61 ND CD56 NEG  cKappa    ND   MPO  ND   CD71 ND CD57 ND   cLambda   ND        CD235aND      06/05/2022 Cancer Staging   Staging form: Chronic Lymphocytic Leukemia / Small Lymphocytic Lymphoma, AJCC 8th Edition - Clinical stage from 06/05/2022: Modified Rai Stage 0 (Modified Rai risk: Low, Lymphocytosis: Present, Adenopathy: Absent, Organomegaly: Absent, Anemia: Absent, Thrombocytopenia: Absent) - Signed by Artis Delay, MD on 06/05/2022 Stage prefix: Initial diagnosis     PHYSICAL EXAMINATION: ECOG PERFORMANCE STATUS: 0 - Asymptomatic  Vitals:   06/11/22 1050  BP: (!) 181/66  Pulse: 79  Resp: 18  Temp: 99.1 F (37.3 C)  SpO2: 100%   Filed Weights   06/11/22 1050  Weight: 156 lb (70.8 kg)    GENERAL:alert, no distress and comfortable NEURO: alert & oriented x 3 with fluent speech, no focal motor/sensory deficits  LABORATORY DATA:  I have reviewed the data as listed    Component  Value Date/Time   NA 139 03/26/2022 1048   K 3.9 03/26/2022 1048   CL 104 03/26/2022 1048   CO2 27 03/26/2022 1048   GLUCOSE 89 03/26/2022 1048   BUN 9 03/26/2022 1048   CREATININE 0.78 03/26/2022 1048   CALCIUM 9.9 03/26/2022 1048   PROT 7.4 03/26/2022 1048   ALBUMIN 4.1 03/26/2022 1048   AST 21 03/26/2022 1048   ALT 13 03/26/2022 1048   ALKPHOS 70 03/26/2022 1048   BILITOT 0.4 03/26/2022 1048    No results found for: "SPEP", "UPEP"  Lab Results  Component Value Date  WBC 16.4 (H) 06/01/2022   NEUTROABS 3.0 06/01/2022   HGB 13.5 06/01/2022   HCT 41.1 06/01/2022   MCV 92.4 06/01/2022   PLT 233 06/01/2022      Chemistry      Component Value Date/Time   NA 139 03/26/2022 1048   K 3.9 03/26/2022 1048   CL 104 03/26/2022 1048   CO2 27 03/26/2022 1048   BUN 9 03/26/2022 1048   CREATININE 0.78 03/26/2022 1048      Component Value Date/Time   CALCIUM 9.9 03/26/2022 1048   ALKPHOS 70 03/26/2022 1048   AST 21 03/26/2022 1048   ALT 13 03/26/2022 1048   BILITOT 0.4 03/26/2022 1048       RADIOGRAPHIC STUDIES: I have personally reviewed the radiological images as listed and agreed with the findings in the report. MM 3D DIAGNOSTIC MAMMOGRAM UNILATERAL RIGHT BREAST  Result Date: 05/26/2022 CLINICAL DATA:  Screening recall for possible right breast asymmetry. EXAM: DIGITAL DIAGNOSTIC UNILATERAL RIGHT MAMMOGRAM WITH TOMOSYNTHESIS TECHNIQUE: Right digital diagnostic mammography and breast tomosynthesis was performed. COMPARISON:  Previous exam(s). ACR Breast Density Category b: There are scattered areas of fibroglandular density. FINDINGS: Additional tomograms were performed of the right breast. The initially questioned possible right breast asymmetry resolves on the additional imaging with findings compatible with an area of overlapping fibroglandular tissue. There is no mammographic evidence of malignancy in the right breast. IMPRESSION: No mammographic evidence of malignancy in  the right breast. RECOMMENDATION: Screening mammogram in one year.(Code:SM-B-01Y) I have discussed the findings and recommendations with the patient. If applicable, a reminder letter will be sent to the patient regarding the next appointment. BI-RADS CATEGORY  1: Negative. Electronically Signed   By: Edwin Cap M.D.   On: 05/26/2022 10:09

## 2022-06-11 NOTE — Assessment & Plan Note (Signed)
I gave her copies of test results We discussed the natural history of CLL The patient is educated to watch out for signs and symptoms of disease progression I plan to see her once a year, spaced out approximately 6 months away from her primary care doctor's office visit Due to stability of her lymphocyte count, I will see her again around August 2025 Discussed importance of yearly vaccination and avoidance of excessive sun exposure

## 2022-07-12 ENCOUNTER — Encounter: Payer: Self-pay | Admitting: Internal Medicine

## 2022-10-08 ENCOUNTER — Ambulatory Visit: Payer: Medicare PPO | Admitting: Internal Medicine

## 2022-10-08 VITALS — BP 164/90 | HR 99 | Temp 98.4°F | Ht 64.0 in | Wt 154.0 lb

## 2022-10-08 DIAGNOSIS — Z23 Encounter for immunization: Secondary | ICD-10-CM

## 2022-10-08 DIAGNOSIS — Z1283 Encounter for screening for malignant neoplasm of skin: Secondary | ICD-10-CM | POA: Diagnosis not present

## 2022-10-08 DIAGNOSIS — C9110 Chronic lymphocytic leukemia of B-cell type not having achieved remission: Secondary | ICD-10-CM

## 2022-10-08 NOTE — Progress Notes (Unsigned)
   Subjective:   Patient ID: Latoya Reyes, female    DOB: 11-27-1955, 67 y.o.   MRN: [id number]  HPI The patient is a 67 YO female coming in for skin concerns.   Review of Systems  Objective:  Physical Exam  Vitals:   10/08/22 1351 10/08/22 1353  BP: (!) 164/90 (!) 164/90  Pulse: 99   Temp: 98.4 F (36.9 C)   TempSrc: Oral   SpO2: 98%   Weight: 154 lb (69.9 kg)   Height: 5\' 4"  (1.626 m)     Assessment & Plan:

## 2022-10-11 ENCOUNTER — Encounter: Payer: Self-pay | Admitting: Internal Medicine

## 2022-10-11 NOTE — Assessment & Plan Note (Signed)
Given her CLL she is more at risk for skin cancers due to impaired damage repair. Referral to dermatology for whole body check. No concerning lesions on exam today.

## 2022-10-22 DIAGNOSIS — M810 Age-related osteoporosis without current pathological fracture: Secondary | ICD-10-CM | POA: Diagnosis not present

## 2022-11-06 ENCOUNTER — Ambulatory Visit (HOSPITAL_BASED_OUTPATIENT_CLINIC_OR_DEPARTMENT_OTHER)
Admission: RE | Admit: 2022-11-06 | Discharge: 2022-11-06 | Disposition: A | Discharge status: Home / Self Care | Payer: Medicare PPO | Source: Ambulatory Visit | Attending: Obstetrics and Gynecology | Admitting: Obstetrics and Gynecology

## 2022-11-06 DIAGNOSIS — Z780 Asymptomatic menopausal state: Secondary | ICD-10-CM | POA: Insufficient documentation

## 2022-11-06 DIAGNOSIS — M8589 Other specified disorders of bone density and structure, multiple sites: Secondary | ICD-10-CM | POA: Insufficient documentation

## 2022-11-06 DIAGNOSIS — Z8262 Family history of osteoporosis: Secondary | ICD-10-CM | POA: Diagnosis not present

## 2022-11-06 DIAGNOSIS — M8580 Other specified disorders of bone density and structure, unspecified site: Secondary | ICD-10-CM | POA: Insufficient documentation

## 2022-11-06 DIAGNOSIS — Z13820 Encounter for screening for osteoporosis: Secondary | ICD-10-CM | POA: Insufficient documentation

## 2023-01-17 DIAGNOSIS — Z823 Family history of stroke: Secondary | ICD-10-CM | POA: Diagnosis not present

## 2023-01-17 DIAGNOSIS — Z79899 Other long term (current) drug therapy: Secondary | ICD-10-CM | POA: Diagnosis not present

## 2023-01-17 DIAGNOSIS — M810 Age-related osteoporosis without current pathological fracture: Secondary | ICD-10-CM | POA: Diagnosis not present

## 2023-01-17 DIAGNOSIS — Z7983 Long term (current) use of bisphosphonates: Secondary | ICD-10-CM | POA: Diagnosis not present

## 2023-01-17 DIAGNOSIS — Z833 Family history of diabetes mellitus: Secondary | ICD-10-CM | POA: Diagnosis not present

## 2023-01-17 DIAGNOSIS — R030 Elevated blood-pressure reading, without diagnosis of hypertension: Secondary | ICD-10-CM | POA: Diagnosis not present

## 2023-01-17 DIAGNOSIS — J309 Allergic rhinitis, unspecified: Secondary | ICD-10-CM | POA: Diagnosis not present

## 2023-01-17 DIAGNOSIS — Z8249 Family history of ischemic heart disease and other diseases of the circulatory system: Secondary | ICD-10-CM | POA: Diagnosis not present

## 2023-01-17 DIAGNOSIS — R32 Unspecified urinary incontinence: Secondary | ICD-10-CM | POA: Diagnosis not present

## 2023-03-18 ENCOUNTER — Telehealth: Payer: Self-pay

## 2023-03-18 ENCOUNTER — Ambulatory Visit (INDEPENDENT_AMBULATORY_CARE_PROVIDER_SITE_OTHER): Payer: Medicare PPO

## 2023-03-18 VITALS — BP 148/78 | HR 67 | Ht 65.0 in | Wt 155.6 lb

## 2023-03-18 DIAGNOSIS — Z0000 Encounter for general adult medical examination without abnormal findings: Secondary | ICD-10-CM | POA: Diagnosis not present

## 2023-03-18 DIAGNOSIS — C9110 Chronic lymphocytic leukemia of B-cell type not having achieved remission: Secondary | ICD-10-CM

## 2023-03-18 NOTE — Telephone Encounter (Signed)
 Spoke w/Ashley at [facility].  Informed her of the new referral by Dr Hillard Danker for the pt that was sent 09/2022.  The referral was acknowledged and she plans to contact the pt today to schedule.  Due to her having a hx of CLL she will schedule her sooner than Sept. 2025 which was the soonest appt.Marland Kitchen

## 2023-03-18 NOTE — Patient Instructions (Addendum)
 Latoya Reyes , Thank you for taking time to come for your Medicare Wellness Visit. I appreciate your ongoing commitment to your health goals. Please review the following plan we discussed and let me know if I can assist you in the future.   Referrals/Orders/Follow-Ups/Clinician Recommendations: Aim for 30 minutes of exercise or brisk walking, 6-8 glasses of water, and 5 servings of fruits and vegetables each day.   This is a list of the screening recommended for you and due dates:  Health Maintenance  Topic Date Due   DTaP/Tdap/Td vaccine (1 - Tdap) Never done   Zoster (Shingles) Vaccine (1 of 2) Never done   Colon Cancer Screening  08/12/2022   COVID-19 Vaccine (6 - 2024-25 season) 09/30/2022   Mammogram  03/27/2023*   Medicare Annual Wellness Visit  03/17/2024   Pneumonia Vaccine  Completed   Flu Shot  Completed   DEXA scan (bone density measurement)  Completed   Hepatitis C Screening  Completed   HPV Vaccine  Aged Out  *Topic was postponed. The date shown is not the original due date.    Advanced directives: (Copy Requested) Please bring a copy of your health care power of attorney and living will to the office to be added to your chart at your convenience.  Next Medicare Annual Wellness Visit scheduled for next year: Yes - 03/2024

## 2023-03-18 NOTE — Progress Notes (Signed)
 Subjective:   Latoya Reyes is a 68 y.o. female who presents for Medicare Annual (Subsequent) preventive examination.  Visit Complete: In person Cardiac Risk Factors include: advanced age (>2men, >44 women);hypertension     Objective:    Today's Vitals   03/18/23 1003 03/18/23 1039  BP: (!) 152/80 (!) 148/78  Pulse: 67   Weight: 155 lb 9.6 oz (70.6 kg)   Height: 5\' 5"  (1.651 m)    Body mass index is 25.89 kg/m.     03/18/2023    9:58 AM  Advanced Directives  Does Patient Have a Medical Advance Directive? Yes  Type of Estate agent of [place];Living will  Copy of Healthcare Power of Attorney in Chart? No - copy requested    Current Medications (verified) Outpatient Encounter Medications as of 03/18/2023  Medication Sig   alendronate (FOSAMAX) 70 MG tablet Take 70 mg by mouth once a week.   calcium carbonate (OS-CAL) 600 MG TABS Take 1,000 mg by mouth daily.   Cranberry-Cholecalciferol 4200-500 MG-UNIT CAPS Take by mouth daily.   famotidine (PEPCID) 20 MG tablet Take 20 mg by mouth daily.   fluticasone (FLONASE) 50 MCG/ACT nasal spray SPRAY 2 SPRAYS INTO EACH NOSTRIL EVERY DAY/as needed   glucosamine-chondroitin 500-400 MG tablet Take 1 tablet by mouth 2 (two) times daily.    magnesium gluconate (MAGONATE) 500 MG tablet Take 500 mg by mouth daily.   Multiple Vitamin (MULTIVITAMIN) capsule Take 1 capsule by mouth daily.   Nutritional Supplements (ESTRO SUPPORT ES PO) Take by mouth.   Omega-3 Fatty Acids (FISH OIL) 1200 MG CAPS Take by mouth daily.   No facility-administered encounter medications on file as of 03/18/2023.    Allergies (verified) Patient has no known allergies.   History: Past Medical History:  Diagnosis Date   Allergy    Arthritis    foot; right   Osteoporosis    Past Surgical History:  Procedure Laterality Date   FOOT SURGERY  2010   right   Family History  Problem Relation Age of Onset   Diabetes Mother     Kidney disease Mother    Diabetes Father    Heart disease Father    Diabetes Brother    Colon cancer Neg Hx    Social History   Socioeconomic History   Marital status: Married    Spouse name: Onalee Hua   Number of children: 1   Years of education: Not on file   Highest education level: Bachelor's degree (e.g., BA, AB, BS)  Occupational History   Occupation: retired Runner, broadcasting/film/video  Tobacco Use   Smoking status: Never   Smokeless tobacco: Never  Substance and Sexual Activity   Alcohol use: No   Drug use: No   Sexual activity: Not on file  Other Topics Concern   Not on file  Social History Narrative   Not on file   Social Drivers of Health   Financial Resource Strain: Low Risk  (03/18/2023)   Overall Financial Resource Strain (CARDIA)    Difficulty of Paying Living Expenses: Not hard at all  Food Insecurity: No Food Insecurity (03/18/2023)   Hunger Vital Sign    Worried About Running Out of Food in the Last Year: Never true    Ran Out of Food in the Last Year: Never true  Transportation Needs: No Transportation Needs (03/18/2023)   PRAPARE - Administrator, Civil Service (Medical): No    Lack of Transportation (Non-Medical): No  Physical Activity: Sufficiently  Active (03/18/2023)   Exercise Vital Sign    Days of Exercise per Week: 5 days    Minutes of Exercise per Session: 60 min  Stress: No Stress Concern Present (03/18/2023)   [employer] of Occupational Health - Occupational Stress Questionnaire    Feeling of Stress : Not at all  Social Connections: Socially Integrated (03/18/2023)   Social Connection and Isolation Panel [NHANES]    Frequency of Communication with Friends and Family: More than three times a week    Frequency of Social Gatherings with Friends and Family: More than three times a week    Attends Religious Services: More than 4 times per year    Active Member of [employer] or Organizations: Yes    Attends Engineer, structural: More than 4 times  per year    Marital Status: Married    Tobacco Counseling Counseling given: Not Answered   Clinical Intake:  Pre-visit preparation completed: Yes  Pain : No/denies pain     BMI - recorded: 25.89 Nutritional Status: BMI 25 -29 Overweight Nutritional Risks: None Diabetes: No  How often do you need to have someone help you when you read instructions, pamphlets, or other written materials from your doctor or pharmacy?: 1 - Never  Interpreter Needed?: No  Information entered by :: Hassell Halim, CMA   Activities of Daily Living    03/18/2023   10:06 AM 08/05/2022    5:48 PM  In your present state of health, do you have any difficulty performing the following activities:  Hearing? 0 0  Vision? 0 0  Difficulty concentrating or making decisions? 0 0  Walking or climbing stairs? 0 0  Dressing or bathing? 0 0  Doing errands, shopping? 0 0  Preparing Food and eating ? N N  Using the Toilet? N N  In the past six months, have you accidently leaked urine? N Y  Do you have problems with loss of bowel control? N N  Managing your Medications? N N  Managing your Finances? N N  Housekeeping or managing your Housekeeping? N N    Patient Care Team: Myrlene Broker, MD as PCP - General (Internal Medicine)  Indicate any recent Medical Services you may have received from other than [facility] providers in the past year (date may be approximate).     Assessment:   This is a routine wellness examination for [place].  Hearing/Vision screen Hearing Screening - Comments:: Denies hearing difficulties   Vision Screening - Comments:: Wears rx glasses - up to date with routine eye exams with  [facility]   Goals Addressed               This Visit's Progress     Patient Stated (pt-stated)        Patient stated she wants to lose weight (10lbs).         Depression Screen    03/18/2023   10:11 AM 03/26/2022   10:09 AM 02/28/2021    1:04 PM 01/10/2017    1:34 PM  PHQ 2/9 Scores   PHQ - 2 Score 0 0 0 0  PHQ- 9 Score  0      Fall Risk    03/18/2023   10:07 AM 10/08/2022    1:53 PM 08/05/2022    5:48 PM 03/26/2022   10:09 AM  Fall Risk   Falls in the past year? 0 0 0 0  Number falls in past yr: 0 0 0 0  Injury with  Fall? 0 0 0 0  Risk for fall due to : No Fall Risks     Follow up Falls prevention discussed;Falls evaluation completed Falls evaluation completed  Falls evaluation completed    MEDICARE RISK AT HOME: Medicare Risk at Home Any stairs in or around the home?: No If so, are there any without handrails?: No Home free of loose throw rugs in walkways, pet beds, electrical cords, etc?: Yes Adequate lighting in your home to reduce risk of falls?: Yes Life alert?: No Use of a cane, walker or w/c?: No Grab bars in the bathroom?: No Shower chair or bench in shower?: No Elevated toilet seat or a handicapped toilet?: Yes  TIMED UP AND GO:  Was the test performed?  No    Cognitive Function:        03/18/2023   10:09 AM  6CIT Screen  What Year? 0 points  What month? 0 points  What time? 0 points  Count back from 20 0 points  Months in reverse 0 points  Repeat phrase 0 points  Total Score 0 points    Immunizations Immunization History  Administered Date(s) Administered   Fluad Trivalent(High Dose 65+) 10/08/2022   Hepatitis A, Adult 01/10/2017   Influenza, High Dose Seasonal PF 10/27/2020   Influenza,inj,Quad PF,6+ Mos 11/16/2013, 11/13/2019   Influenza-Unspecified 11/29/2012, 11/22/2013, 11/01/2014, 10/28/2018, 10/29/2020, 11/03/2021   Moderna Sars-Covid-2 Vaccination 03/06/2019, 03/31/2019   PFIZER(Purple Top)SARS-COV-2 Vaccination 11/23/2019   Pfizer Covid-19 Vaccine Bivalent Booster 50yrs & up 10/27/2020, 11/03/2021   Pneumococcal Conjugate-13 12/30/2018   Pneumococcal Polysaccharide-23 01/04/2020    TDAP status: Due, Education has been provided regarding the importance of this vaccine. Advised may receive this vaccine at local pharmacy  or Health Dept. Aware to provide a copy of the vaccination record if obtained from local pharmacy or Health Dept. Verbalized acceptance and understanding.  Flu Vaccine status: Up to date - 10/08/2022  Pneumococcal vaccine status: Up to date - 01/04/2020  Covid-19 vaccine status: Information provided on how to obtain vaccines.   Qualifies for Shingles Vaccine? Yes   Zostavax completed No   Shingrix Completed?: No.    Education has been provided regarding the importance of this vaccine. Patient has been advised to call insurance company to determine out of pocket expense if they have not yet received this vaccine. Advised may also receive vaccine at local pharmacy or Health Dept. Verbalized acceptance and understanding.  Screening Tests Health Maintenance  Topic Date Due   DTaP/Tdap/Td (1 - Tdap) Never done   Zoster Vaccines- Shingrix (1 of 2) Never done   Colonoscopy  08/12/2022   COVID-19 Vaccine (6 - 2024-25 season) 09/30/2022   MAMMOGRAM  03/27/2023 (Originally 10/30/2020)   Medicare Annual Wellness (AWV)  03/17/2024   Pneumonia Vaccine 68+ Years old  Completed   INFLUENZA VACCINE  Completed   DEXA SCAN  Completed   Hepatitis C Screening  Completed   HPV VACCINES  Aged Out    Health Maintenance  Health Maintenance Due  Topic Date Due   DTaP/Tdap/Td (1 - Tdap) Never done   Zoster Vaccines- Shingrix (1 of 2) Never done   Colonoscopy  08/12/2022   COVID-19 Vaccine (6 - 2024-25 season) 09/30/2022   Colorectal screening status: Per pt states completed a Cologuard in 2024 (Neg results).  Repeat due in 57yrs.  Mammogram status: Repeat due in 2025 - Gynecologist orders.  Bone Density status: Completed 11/06/2022. Results reflect: Bone density results: OSTEOPOROSIS. Repeat every 2 years.   Additional Screening:  Hepatitis C Screening: does qualify; Completed 08/06/2017  Vision Screening: Recommended annual ophthalmology exams for early detection of glaucoma and other disorders of the  eye. Is the patient up to date with their annual eye exam?  Yes  Who is the provider or what is the name of the office in which the patient attends annual eye exams? [facility] If pt is not established with a provider, would they like to be referred to a provider to establish care? No .   Dental Screening: Recommended annual dental exams for proper oral hygiene   Community Resource Referral / Chronic Care Management: CRR required this visit?  No   CCM required this visit?  No     Plan:     I have personally reviewed and noted the following in the patient's chart:   Medical and social history Use of alcohol, tobacco or illicit drugs  Current medications and supplements including opioid prescriptions. Patient is not currently taking opioid prescriptions. Functional ability and status Nutritional status Physical activity Advanced directives List of other physicians Hospitalizations, surgeries, and ER visits in previous 12 months Vitals Screenings to include cognitive, depression, and falls Referrals and appointments  In addition, I have reviewed and discussed with patient certain preventive protocols, quality metrics, and best practice recommendations. A written personalized care plan for preventive services as well as general preventive health recommendations were provided to patient.     Darreld Mclean, CMA   03/18/2023   After Visit Summary: (MyChart) Due to this being a telephonic visit, the after visit summary with patients personalized plan was offered to patient via MyChart   Nurse Notes: Referral to Dermatologist for CLL dx.  Pt will obtain the Tdap vaccine and obtain COVID/Zoster vaccines list from pharmacy.

## 2023-03-18 NOTE — Addendum Note (Signed)
 Addended by: Darreld Mclean on: 03/18/2023 01:45 PM   Modules accepted: Orders

## 2023-03-31 ENCOUNTER — Other Ambulatory Visit: Payer: Self-pay | Admitting: Internal Medicine

## 2023-04-01 ENCOUNTER — Encounter: Payer: Self-pay | Admitting: Internal Medicine

## 2023-04-01 ENCOUNTER — Ambulatory Visit (INDEPENDENT_AMBULATORY_CARE_PROVIDER_SITE_OTHER): Payer: Medicare PPO | Admitting: Internal Medicine

## 2023-04-01 VITALS — BP 118/88 | HR 69 | Temp 97.7°F | Ht 65.0 in | Wt 155.0 lb

## 2023-04-01 DIAGNOSIS — M810 Age-related osteoporosis without current pathological fracture: Secondary | ICD-10-CM | POA: Diagnosis not present

## 2023-04-01 DIAGNOSIS — Z0000 Encounter for general adult medical examination without abnormal findings: Secondary | ICD-10-CM | POA: Diagnosis not present

## 2023-04-01 DIAGNOSIS — C9110 Chronic lymphocytic leukemia of B-cell type not having achieved remission: Secondary | ICD-10-CM | POA: Diagnosis not present

## 2023-04-01 LAB — COMPREHENSIVE METABOLIC PANEL
ALT: 14 U/L (ref 0–35)
AST: 21 U/L (ref 0–37)
Albumin: 4.3 g/dL (ref 3.5–5.2)
Alkaline Phosphatase: 51 U/L (ref 39–117)
BUN: 14 mg/dL (ref 6–23)
CO2: 28 meq/L (ref 19–32)
Calcium: 9.7 mg/dL (ref 8.4–10.5)
Chloride: 106 meq/L (ref 96–112)
Creatinine, Ser: 0.81 mg/dL (ref 0.40–1.20)
GFR: 74.03 mL/min (ref >60.00)
Glucose, Bld: 86 mg/dL (ref 70–99)
Potassium: 4.4 meq/L (ref 3.5–5.1)
Sodium: 141 meq/L (ref 135–145)
Total Bilirubin: 0.3 mg/dL (ref 0.2–1.2)
Total Protein: 7.1 g/dL (ref 6.0–8.3)

## 2023-04-01 LAB — CBC WITH DIFFERENTIAL/PLATELET
Basophils Absolute: 0.1 10*3/uL (ref 0.0–0.1)
Basophils Relative: 0.3 % (ref 0.0–3.0)
Eosinophils Absolute: 0.2 10*3/uL (ref 0.0–0.7)
Eosinophils Relative: 1.3 % (ref 0.0–5.0)
HCT: 43 % (ref 36.0–46.0)
Hemoglobin: 14 g/dL (ref 12.0–15.0)
Lymphocytes Relative: 73.1 % — ABNORMAL HIGH (ref 12.0–46.0)
Lymphs Abs: 11.9 10*3/uL — ABNORMAL HIGH (ref 0.7–4.0)
MCHC: 32.6 g/dL (ref 30.0–36.0)
MCV: 91.2 fl (ref 78.0–100.0)
Monocytes Absolute: 0.4 10*3/uL (ref 0.1–1.0)
Monocytes Relative: 2.6 % — ABNORMAL LOW (ref 3.0–12.0)
Neutro Abs: 3.7 10*3/uL (ref 1.4–7.7)
Neutrophils Relative %: 22.7 % — ABNORMAL LOW (ref 43.0–77.0)
Platelets: 243 10*3/uL (ref 150.0–400.0)
RBC: 4.72 Mil/uL (ref 3.87–5.11)
RDW: 13.5 % (ref 11.5–15.5)
WBC: 16.3 10*3/uL — ABNORMAL HIGH (ref 4.0–10.5)

## 2023-04-01 LAB — LIPID PANEL
Cholesterol: 193 mg/dL (ref 0–200)
HDL: 61.5 mg/dL (ref >39.00)
LDL Cholesterol: 89 mg/dL (ref 0–99)
NonHDL: 131.64
Total CHOL/HDL Ratio: 3
Triglycerides: 212 mg/dL — ABNORMAL HIGH (ref 0.0–149.0)
VLDL: 42.4 mg/dL — ABNORMAL HIGH (ref 0.0–40.0)

## 2023-04-01 MED ORDER — FLUTICASONE PROPIONATE 50 MCG/ACT NA SUSP: NASAL | 3 refills | Status: AC

## 2023-04-01 NOTE — Progress Notes (Unsigned)
   Subjective:   Patient ID: Latoya Reyes, female    DOB: 13-Dec-1955, 68 y.o.   MRN: [id number]  HPI The patient is here for physical. Also with right ear pain for some time.  PM[facility], [facility], social history reviewed and updated  Review of Systems  Objective:  Physical Exam  Vitals:   04/01/23 1034  BP: 118/88  Pulse: 69  Temp: 97.7 F (36.5 C)  TempSrc: Oral  SpO2: 98%  Weight: 155 lb (70.3 kg)  Height: 5\' 5"  (1.651 m)    Assessment & Plan:

## 2023-04-04 ENCOUNTER — Encounter: Payer: Self-pay | Admitting: Internal Medicine

## 2023-04-04 DIAGNOSIS — M810 Age-related osteoporosis without current pathological fracture: Secondary | ICD-10-CM | POA: Insufficient documentation

## 2023-04-04 NOTE — Assessment & Plan Note (Signed)
 Checking CBC with diff and generally gets every 6 month monitoring. Not on treatment.

## 2023-04-04 NOTE — Assessment & Plan Note (Signed)
 Taking fosamax weekly and will continue.

## 2023-04-04 NOTE — Assessment & Plan Note (Signed)
 Flu shot up to date. Pneumonia complete. Shingrix due at pharmacy. Tetanus due at pharmacy. Cologuard upto date. Mammogram up to date, pap smear aged out and dexa complete. Counseled about sun safety and mole surveillance. Counseled about the dangers of distracted driving. Given 10 year screening recommendations.

## 2023-04-10 ENCOUNTER — Encounter: Payer: Self-pay | Admitting: Dermatology

## 2023-04-10 ENCOUNTER — Ambulatory Visit: Payer: Medicare PPO | Admitting: Dermatology

## 2023-04-10 VITALS — BP 144/92

## 2023-04-10 DIAGNOSIS — D1801 Hemangioma of skin and subcutaneous tissue: Secondary | ICD-10-CM | POA: Diagnosis not present

## 2023-04-10 DIAGNOSIS — Z1283 Encounter for screening for malignant neoplasm of skin: Secondary | ICD-10-CM

## 2023-04-10 DIAGNOSIS — L814 Other melanin hyperpigmentation: Secondary | ICD-10-CM

## 2023-04-10 DIAGNOSIS — L570 Actinic keratosis: Secondary | ICD-10-CM

## 2023-04-10 DIAGNOSIS — L578 Other skin changes due to chronic exposure to nonionizing radiation: Secondary | ICD-10-CM | POA: Diagnosis not present

## 2023-04-10 DIAGNOSIS — W908XXA Exposure to other nonionizing radiation, initial encounter: Secondary | ICD-10-CM | POA: Diagnosis not present

## 2023-04-10 DIAGNOSIS — D229 Melanocytic nevi, unspecified: Secondary | ICD-10-CM

## 2023-04-10 NOTE — Progress Notes (Signed)
   New Patient Visit   Subjective  Latoya Reyes is a 68 y.o. female who presents for the following:  Total Body Skin Exam (TBSE)  Patient present today for new patient visit for TBSE.The patient denies she has spots, moles and lesions to be evaluated, some may be new or changing. Patient has not previously been treated by a dermatologist.Patient reports she does not have hx of bx. Patient denies family history of skin cancers. Patient reports throughout her lifetime has had complete sun exposure during your younger years but over the last 20-30 years she considers her exposure as minimal. Currently, patient reports if she has excessive sun exposure, she does apply sunscreen and/or wears protective coverings.  The following portions of the chart were reviewed this encounter and updated as appropriate: medications, allergies, medical history  Review of Systems:  No other skin or systemic complaints except as noted in HPI or Assessment and Plan.  Objective  Well appearing patient in no apparent distress; mood and affect are within normal limits.  A full examination was performed including scalp, head, eyes, ears, nose, lips, neck, chest, axillae, abdomen, back, buttocks, bilateral upper extremities, bilateral lower extremities, hands, feet, fingers, toes, fingernails, and toenails. All findings within normal limits unless otherwise noted below.   Relevant exam findings are noted in the Assessment and Plan.  L Forehead (2) Erythematous thin papules/macules with gritty scale.   Assessment & Plan   LENTIGINES, HEMANGIOMAS - Benign normal skin lesions - Benign-appearing - Call for any changes  BENIGN MELANOCYTIC NEVI - Tan-brown and/or pink-flesh-colored symmetric macules and papules - Benign appearing on exam today - Observation - Call clinic for new or changing moles - Recommend daily use of broad spectrum spf 30+ sunscreen to sun-exposed areas.   MILD ACTINIC DAMAGE - Chronic  condition, secondary to cumulative UV/sun exposure - diffuse scaly erythematous macules with underlying dyspigmentation - Recommend daily broad spectrum sunscreen SPF 30+ to sun-exposed areas, reapply every 2 hours as needed.  - Staying in the shade or wearing long sleeves, sun glasses (UVA+UVB protection) and wide brim hats (4-inch brim around the entire circumference of the hat) are also recommended for sun protection.  - Call for new or changing lesions.  SKIN CANCER SCREENING PERFORMED TODAY  AK (ACTINIC KERATOSIS) (2) L Forehead (2) Destruction of lesion - L Forehead (2) Complexity: simple   Destruction method: cryotherapy   Informed consent: discussed and consent obtained   Timeout:  patient name, date of birth, surgical site, and procedure verified Lesion destroyed using liquid nitrogen: Yes   Region frozen until ice ball extended beyond lesion: Yes   Outcome: patient tolerated procedure well with no complications   Post-procedure details: wound care instructions given    Return in about 2 months (around 06/10/2023) for AK follow up & 44yr for TBSE.   Documentation: I have reviewed the above documentation for accuracy and completeness, and I agree with the above.   I, Shirron Marcha Solders, CMA, am acting as scribe for Milda Smart, MD.   Gwenith Daily, MD

## 2023-04-10 NOTE — Patient Instructions (Addendum)

## 2023-06-11 ENCOUNTER — Ambulatory Visit: Admitting: Dermatology

## 2023-06-11 ENCOUNTER — Encounter: Payer: Self-pay | Admitting: Dermatology

## 2023-06-11 VITALS — BP 131/65 | HR 65

## 2023-06-11 DIAGNOSIS — L570 Actinic keratosis: Secondary | ICD-10-CM

## 2023-06-11 DIAGNOSIS — Z872 Personal history of diseases of the skin and subcutaneous tissue: Secondary | ICD-10-CM | POA: Diagnosis not present

## 2023-06-11 DIAGNOSIS — W908XXD Exposure to other nonionizing radiation, subsequent encounter: Secondary | ICD-10-CM | POA: Diagnosis not present

## 2023-06-11 DIAGNOSIS — L578 Other skin changes due to chronic exposure to nonionizing radiation: Secondary | ICD-10-CM | POA: Diagnosis not present

## 2023-06-11 NOTE — Patient Instructions (Signed)

## 2023-06-11 NOTE — Progress Notes (Signed)
   Follow-Up Visit   Subjective  Latoya Reyes is a 68 y.o. female who presents for the following: AKs Pt here to follow up on Aks treated with LN2 on 04/10/23. Pt states they seem to have resolved.  The following portions of the chart were reviewed this encounter and updated as appropriate: medications, allergies, medical history  Review of Systems:  No other skin or systemic complaints except as noted in HPI or Assessment and Plan.  Objective  Well appearing patient in no apparent distress; mood and affect are within normal limits.  A focused examination was performed of the following areas: forehead  Relevant exam findings are noted in the Assessment and Plan.    Assessment & Plan   ACTINIC DAMAGE - chronic, secondary to cumulative UV radiation exposure/sun exposure over time - diffuse scaly erythematous macules with underlying dyspigmentation - Recommend daily broad spectrum sunscreen SPF 30+ to sun-exposed areas, reapply every 2 hours as needed.  - Recommend staying in the shade or wearing long sleeves, sun glasses (UVA+UVB protection) and wide brim hats (4-inch brim around the entire circumference of the hat). - Call for new or changing lesions.  HISTORY OF PRECANCEROUS ACTINIC KERATOSIS left forehead- cleared s/p cryotherapy - site(s) of PreCancerous Actinic Keratosis clear today. - these may recur and new lesions may form requiring treatment to prevent transformation into skin cancer - observe for new or changing spots and contact [facility] for appointment if occur - photoprotection with sun protective clothing; sunglasses and broad spectrum sunscreen with SPF of at least 30 + and frequent self skin exams recommended - yearly exams by a dermatologist recommended for persons with history of PreCancerous Actinic Keratoses   Return in about 1 year (around 06/10/2024) for TBSE.  I, Wilson Hasten, CMA, am acting as scribe for Deneise Finlay, MD.    Documentation: I have reviewed the above documentation for accuracy and completeness, and I agree with the above.  Deneise Finlay, MD

## 2023-07-08 DIAGNOSIS — H33321 Round hole, right eye: Secondary | ICD-10-CM | POA: Diagnosis not present

## 2023-09-16 ENCOUNTER — Other Ambulatory Visit (HOSPITAL_COMMUNITY): Payer: Self-pay | Admitting: Hematology and Oncology

## 2023-09-16 DIAGNOSIS — C9110 Chronic lymphocytic leukemia of B-cell type not having achieved remission: Secondary | ICD-10-CM

## 2023-09-17 ENCOUNTER — Inpatient Hospital Stay: Payer: Medicare PPO

## 2023-09-17 ENCOUNTER — Encounter: Payer: Self-pay | Admitting: Hematology and Oncology

## 2023-09-17 ENCOUNTER — Inpatient Hospital Stay: Payer: Medicare PPO | Attending: Hematology and Oncology | Admitting: Hematology and Oncology

## 2023-09-17 ENCOUNTER — Other Ambulatory Visit: Payer: Medicare PPO

## 2023-09-17 ENCOUNTER — Ambulatory Visit: Payer: Medicare PPO | Admitting: Hematology and Oncology

## 2023-09-17 VITALS — BP 144/64 | HR 73 | Resp 18 | Ht 65.0 in | Wt 155.4 lb

## 2023-09-17 DIAGNOSIS — Z79899 Other long term (current) drug therapy: Secondary | ICD-10-CM | POA: Insufficient documentation

## 2023-09-17 DIAGNOSIS — C9110 Chronic lymphocytic leukemia of B-cell type not having achieved remission: Secondary | ICD-10-CM | POA: Diagnosis not present

## 2023-09-17 LAB — CBC WITH DIFFERENTIAL (CANCER CENTER ONLY)
Abs Immature Granulocytes: 0.03 K/uL (ref 0.00–0.07)
Basophils Absolute: 0.1 K/uL (ref 0.0–0.1)
Basophils Relative: 1 %
Eosinophils Absolute: 0.4 K/uL (ref 0.0–0.5)
Eosinophils Relative: 2 %
HCT: 42.2 % (ref 36.0–46.0)
Hemoglobin: 13.6 g/dL (ref 12.0–15.0)
Immature Granulocytes: 0 %
Lymphocytes Relative: 74 %
Lymphs Abs: 15.2 K/uL — ABNORMAL HIGH (ref 0.7–4.0)
MCH: 29.6 pg (ref 26.0–34.0)
MCHC: 32.2 g/dL (ref 30.0–36.0)
MCV: 91.9 fL (ref 80.0–100.0)
Monocytes Absolute: 0.8 K/uL (ref 0.1–1.0)
Monocytes Relative: 4 %
Neutro Abs: 3.9 K/uL (ref 1.7–7.7)
Neutrophils Relative %: 19 %
Platelet Count: 238 K/uL (ref 150–400)
RBC: 4.59 MIL/uL (ref 3.87–5.11)
RDW: 13 % (ref 11.5–15.5)
Smear Review: NORMAL
WBC Count: 20.4 K/uL — ABNORMAL HIGH (ref 4.0–10.5)
nRBC: 0 % (ref 0.0–0.2)

## 2023-09-17 NOTE — Assessment & Plan Note (Addendum)
 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

## 2023-09-17 NOTE — Progress Notes (Signed)
 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 She is currently on observation and is completely asymptomatic Examination is benign I reviewed CBC today which show slightly elevated lymphocytosis in comparison with her blood work from 6 months ago We will continue observation I will see her next year for further follow-up She will see her primary care doctor with repeat labs in March 2026  Orders Placed This Encounter  Procedures   CBC with Differential (Cancer Reyes Only)    Standing Status:   Future    Expiration Date:   09/16/2024     Reyes Bedford, MD  INTERVAL HISTORY: she returns for surveillance follow-up for CLL She is not symptomatic Denies recent infection or new lymphadenopathy  Oncology History  CLL (chronic lymphocytic leukemia) (HCC)  04/02/2022 Initial Diagnosis   CLL (chronic lymphocytic leukemia) (HCC)   06/01/2022 Pathology Results   Surgical Pathology CASE: [id number] PATIENT: Latoya Reyes Flow Pathology Report  Clinical history: Chronic lymphocytic leukemia  DIAGNOSIS:  Peripheral blood, flow cytometry: -  CD5 positive lymphoproliferative disorder (absolute number = 9.3 x 10  9/L) consistent with chronic lymphocytic leukemia.  Note: There is a lymphocytosis of predominantly small round and mature lymphocytes with a mild amount of chromatin cracking as well as a few scattered smudge cells.  Immunophenotypically the cells are positive for CD5, CD19, CD20 and CD200 and are lambda restricted.  These findings are consistent with chronic lymphocytic leukemia; however, to fully exclude a leukemic phase of mantle cell lymphoma peripheral blood is recommended to be sent for a CCND1-IgH FISH test, as well as a CLL prognostic panel for further workup.  GATING AND  PHENOTYPIC ANALYSIS:  Gated population: Flow cytometric immunophenotyping is performed using antibodies to the antigens listed in the table below. Electronic gates are placed around a cell cluster displaying light scatter properties corresponding to: lymphocytes  Abnormal Cells in gated population: 75%  Phenotype of Abnormal Cells: CD5, CD19, CD20, CD200, Lambda                       Lymphoid Antigens       Myeloid Antigens Miscellaneous CD2  NEG  CD10 NEG  CD11b     ND   CD45 POS CD3  NEG  CD19 POS  CD11c     ND   HLA-Dr    ND CD4  NEG  CD20 POS  CD13 ND   CD34 NEG CD5  POS  CD22 ND   CD14 ND   CD38 NEG CD7  NEG  CD79b     ND   CD15 ND   CD138     ND CD8  NEG  CD103     ND   CD16 ND   TdT  ND CD25 ND   CD200     POS  CD33 ND   CD123     ND TCRab     ND   sKappa    NEG  CD64 ND   CD41 ND TCRgd     NEG  sLambda   POS  CD117     ND   CD61 ND CD56 NEG  cKappa    ND   MPO  ND   CD71 ND CD57 ND   cLambda   ND  [id number]      06/05/2022 Cancer Staging   Staging form: Chronic Lymphocytic Leukemia / Small Lymphocytic Lymphoma, AJCC 8th Edition - Clinical stage from 06/05/2022: Modified Rai Stage 0 (Modified Rai risk: Low, Lymphocytosis: Present, Adenopathy: Absent, Organomegaly: Absent, Anemia: Absent, Thrombocytopenia: Absent) - Signed by Latoya Hicks, MD on 06/05/2022 Stage prefix: Initial diagnosis      PHYSICAL EXAMINATION: ECOG PERFORMANCE STATUS: 0 - Asymptomatic  Vitals:   09/17/23 1251  BP: (!) 144/64  Pulse: 73  Resp: 18  SpO2: 100%   Filed Weights   09/17/23 1251  Weight: 155 lb 6.4 oz (70.5 kg)   Examination today showed no new lymphadenopathy on exam  Relevant data reviewed during this visit included CBC

## 2023-10-16 DIAGNOSIS — Z01419 Encounter for gynecological examination (general) (routine) without abnormal findings: Secondary | ICD-10-CM | POA: Diagnosis not present

## 2023-10-16 DIAGNOSIS — M810 Age-related osteoporosis without current pathological fracture: Secondary | ICD-10-CM | POA: Diagnosis not present

## 2023-10-16 DIAGNOSIS — Z1231 Encounter for screening mammogram for malignant neoplasm of breast: Secondary | ICD-10-CM | POA: Diagnosis not present

## 2023-10-16 DIAGNOSIS — N905 Atrophy of vulva: Secondary | ICD-10-CM | POA: Diagnosis not present

## 2023-12-03 DIAGNOSIS — M810 Age-related osteoporosis without current pathological fracture: Secondary | ICD-10-CM | POA: Diagnosis not present

## 2023-12-03 DIAGNOSIS — Z7983 Long term (current) use of bisphosphonates: Secondary | ICD-10-CM | POA: Diagnosis not present

## 2023-12-03 DIAGNOSIS — Z823 Family history of stroke: Secondary | ICD-10-CM | POA: Diagnosis not present

## 2023-12-03 DIAGNOSIS — Z8572 Personal history of non-Hodgkin lymphomas: Secondary | ICD-10-CM | POA: Diagnosis not present

## 2023-12-03 DIAGNOSIS — K219 Gastro-esophageal reflux disease without esophagitis: Secondary | ICD-10-CM | POA: Diagnosis not present

## 2023-12-03 DIAGNOSIS — N182 Chronic kidney disease, stage 2 (mild): Secondary | ICD-10-CM | POA: Diagnosis not present

## 2023-12-03 DIAGNOSIS — Z8249 Family history of ischemic heart disease and other diseases of the circulatory system: Secondary | ICD-10-CM | POA: Diagnosis not present

## 2023-12-03 DIAGNOSIS — Z833 Family history of diabetes mellitus: Secondary | ICD-10-CM | POA: Diagnosis not present

## 2023-12-12 DIAGNOSIS — H15122 Nodular episcleritis, left eye: Secondary | ICD-10-CM | POA: Diagnosis not present

## 2024-03-18 ENCOUNTER — Ambulatory Visit: Payer: Medicare PPO

## 2024-04-03 ENCOUNTER — Ambulatory Visit: Payer: Medicare PPO

## 2024-04-03 ENCOUNTER — Encounter: Payer: Medicare PPO | Admitting: Internal Medicine

## 2024-04-07 ENCOUNTER — Ambulatory Visit: Admitting: Dermatology

## 2024-10-02 ENCOUNTER — Inpatient Hospital Stay: Admitting: Hematology and Oncology

## 2024-10-02 ENCOUNTER — Inpatient Hospital Stay
